# Patient Record
Sex: Female | Born: 1965 | Hispanic: No | Marital: Single | Smoking: Never smoker
Health system: Southern US, Community
[De-identification: ages and names within clinical notes are randomized; demographics above are authoritative.]

## PROBLEM LIST (undated history)

## (undated) DIAGNOSIS — K589 Irritable bowel syndrome without diarrhea: Secondary | ICD-10-CM

## (undated) DIAGNOSIS — N3010 Interstitial cystitis (chronic) without hematuria: Secondary | ICD-10-CM

## (undated) DIAGNOSIS — K219 Gastro-esophageal reflux disease without esophagitis: Secondary | ICD-10-CM

## (undated) DIAGNOSIS — E079 Disorder of thyroid, unspecified: Secondary | ICD-10-CM

## (undated) DIAGNOSIS — K633 Ulcer of intestine: Secondary | ICD-10-CM

## (undated) DIAGNOSIS — J45909 Unspecified asthma, uncomplicated: Secondary | ICD-10-CM

## (undated) HISTORY — PX: COSMETIC SURGERY: SHX468

## (undated) HISTORY — DX: Ulcer of intestine: K63.3

## (undated) HISTORY — PX: OTHER SURGICAL HISTORY: SHX169

## (undated) HISTORY — DX: Irritable bowel syndrome without diarrhea: K58.9

## (undated) HISTORY — DX: Disorder of thyroid, unspecified: E07.9

## (undated) HISTORY — PX: BREAST ENHANCEMENT SURGERY: SHX7

## (undated) HISTORY — DX: Unspecified asthma, uncomplicated: J45.909

## (undated) HISTORY — DX: Gastro-esophageal reflux disease without esophagitis: K21.9

## (undated) HISTORY — PX: BREAST SURGERY: SHX581

## (undated) HISTORY — DX: Interstitial cystitis (chronic) without hematuria: N30.10

## (undated) HISTORY — PX: TUBAL LIGATION: SHX77

## (undated) HISTORY — PX: SCAR REVISION: SHX5285

## (undated) HISTORY — PX: BUNIONECTOMY: SHX129

## (undated) HISTORY — PX: MYOMECTOMY: SHX85

---

## 2017-12-10 ENCOUNTER — Ambulatory Visit (HOSPITAL_COMMUNITY)
Admission: EM | Admit: 2017-12-10 | Discharge: 2017-12-10 | Disposition: A | Discharge status: Home / Self Care | Payer: BLUE CROSS/BLUE SHIELD | Attending: Family Medicine | Admitting: Family Medicine

## 2017-12-10 ENCOUNTER — Other Ambulatory Visit: Payer: Self-pay

## 2017-12-10 ENCOUNTER — Encounter (HOSPITAL_COMMUNITY): Payer: Self-pay | Admitting: Emergency Medicine

## 2017-12-10 ENCOUNTER — Ambulatory Visit (INDEPENDENT_AMBULATORY_CARE_PROVIDER_SITE_OTHER): Payer: BLUE CROSS/BLUE SHIELD

## 2017-12-10 DIAGNOSIS — S62347A Nondisplaced fracture of base of fifth metacarpal bone. left hand, initial encounter for closed fracture: Secondary | ICD-10-CM

## 2017-12-10 MED ORDER — NAPROXEN 500 MG PO TABS: 500.0000 mg | ORAL_TABLET | Freq: Two times a day (BID) | ORAL | 0 refills | Status: DC

## 2017-12-10 MED ORDER — HYDROCODONE-ACETAMINOPHEN 5-325 MG PO TABS: 1.0000 | ORAL_TABLET | Freq: Four times a day (QID) | ORAL | 0 refills | Status: DC | PRN

## 2017-12-10 NOTE — Discharge Instructions (Signed)
Ulnar gutter splint placed Continue conservative management of rest, ice, compression and elevation Take naproxen as needed for pain relief (may cause abdominal discomfort, ulcers, and GI bleeds avoid taking with other NSAIDs) Norco prescribed for break-through pain Follow up with orthopedist for further evaluation and management Return or go to the ER if you have any new or worsening symptoms (fever, chills, chest pain, abdominal pain, changes in bowel or bladder habits, pain radiating into lower legs, etc...)

## 2017-12-10 NOTE — ED Notes (Signed)
Bed: UC01 Expected date:  Expected time:  Means of arrival:  Comments: For appointments 

## 2017-12-10 NOTE — Progress Notes (Signed)
Orthopedic Tech Progress Note Patient Details:  Vanessa Fischer May 24, 1966 [id number]  Ortho Devices Type of Ortho Device: Ace wrap, Ulna gutter splint Ortho Device/Splint Location: lue Ortho Device/Splint Interventions: Application   Post Interventions Patient Tolerated: Well Instructions Provided: Care of device   Nikki DomCrawford, Violetta Lavalle 12/10/2017, 4:32 PM

## 2017-12-10 NOTE — ED Triage Notes (Signed)
Patient was playing in pool, splashing with a child in the pool, left little finger was pulled to the left, left little finger is painful, bruising to left hand

## 2017-12-10 NOTE — ED Provider Notes (Signed)
[facility]   [id number] 12/10/17 Arrival Time: 1443  SUBJECTIVE: History from: patient. Clark Clowdus is a 52 y.o. female complains of left little finger pain that began yesterday.  It began after she hyperextended her finger after catching a child jumping into pool.  Localizes the pain to the left little finger.  Describes the pain as constant.  Has tried ibuprofen 800mg  without relief.  Symptoms are made worse with flexion.  Denies similar symptoms in the past.  Complains of ecchymosis, erythema, and effusion.  Denies fever, chills, weakness, numbness and tingling.      ROS: As per HPI.  History reviewed. No pertinent past medical history. Past Surgical History:  Procedure Laterality Date  . bone spurs    . BREAST ENHANCEMENT SURGERY    . BREAST SURGERY    . BUNIONECTOMY    . CESAREAN SECTION    . COSMETIC SURGERY    . MYOMECTOMY    . SCAR REVISION    . TUBAL LIGATION     No Known Allergies No current facility-administered medications on file prior to encounter.    Current Outpatient Medications on File Prior to Encounter  Medication Sig Dispense Refill  . amphetamine-dextroamphetamine (ADDERALL) 30 MG tablet Take 30 mg by mouth daily.    . butalbital-acetaminophen-caffeine (FIORICET, ESGIC) 50-325-40 MG tablet Take by mouth 2 (two) times daily as needed for headache.    . doxycycline (VIBRAMYCIN) 100 MG capsule Take 100 mg by mouth 2 (two) times daily.    . Fremanezumab-vfrm (AJOVY Tedrow) Inject into the skin.    Marland Kitchen ibuprofen (ADVIL,MOTRIN) 200 MG tablet Take 200 mg by mouth every 6 (six) hours as needed.    Marland Kitchen LORazepam (ATIVAN) 1 MG tablet Take 1 mg by mouth every 8 (eight) hours.    . Mirabegron (MYRBETRIQ PO) Take by mouth.    Myriam Forehand FORMULARY NP thyroid pill-"natural product"    . progesterone (PROMETRIUM) 200 MG capsule Take 200 mg by mouth daily.     Social History   Socioeconomic History  . Marital status: Single    Spouse name: Not on file  . Number of  children: Not on file  . Years of education: Not on file  . Highest education level: Not on file  Occupational History  . Not on file  Social Needs  . Financial resource strain: Not on file  . Food insecurity:    Worry: Not on file    Inability: Not on file  . Transportation needs:    Medical: Not on file    Non-medical: Not on file  Tobacco Use  . Smoking status: Never Smoker  Substance and Sexual Activity  . Alcohol use: Yes  . Drug use: Never  . Sexual activity: Not on file  Lifestyle  . Physical activity:    Days per week: Not on file    Minutes per session: Not on file  . Stress: Not on file  Relationships  . Social connections:    Talks on phone: Not on file    Gets together: Not on file    Attends religious service: Not on file    Active member of club or organization: Not on file    Attends meetings of clubs or organizations: Not on file    Relationship status: Not on file  . Intimate partner violence:    Fear of current or ex partner: Not on file    Emotionally abused: Not on file    Physically abused: Not on  file    Forced sexual activity: Not on file  Other Topics Concern  . Not on file  Social History Narrative  . Not on file   Family History  Problem Relation Age of Onset  . Diabetes Mother   . Hypertension Father     OBJECTIVE:  Vitals:   12/10/17 1510  BP: 118/78  Pulse: 81  Resp: 18  Temp: 98.6 F (37 C)  TempSrc: Oral  SpO2: 97%    General appearance: AOx3; in no acute distress.  Head: NCAT Lungs: CTA bilaterally Heart: RRR.  Clear S1 and S2 without murmur, gallops, or rubs.  Radial pulses 2+ bilaterally. Musculoskeletal: Left hand Inspection: Skin warm, dry, clear and intact without obvious erythema, effusion, or ecchymosis.  Palpation: Tender to palpation about the fifth metacarpal, MCP joint, proximal phalange and PIP joint ROM: FROM active and passive with discomfort Strength: 4+/5 grip strength Skin: warm and dry Neurologic:  Ambulates without difficulty; Sensation intact about the upper extremities Psychological: alert and cooperative; anxious mood and affect  DIAGNOSTIC STUDIES:   CLINICAL DATA: Finger bent while diving  EXAM: LEFT FIFTH FINGER 2+V  COMPARISON: None.  FINDINGS: Frontal, oblique, and lateral views were obtained. There are nondisplaced fractures of the mid to distal aspects of the fifth metacarpal. No other fractures are evident. No dislocation. Joint spaces appear normal.  IMPRESSION: Nondisplaced fractures mid to distal fifth metacarpal. No other fracture. No dislocation. No evident arthropathy.  These results will be called to the ordering clinician or representative by the Radiologist Assistant, and communication documented in the PACS or zVision Dashboard.   Electronically Signed By: Bretta BangWilliam Woodruff III M.D. On: 12/10/2017 15:49  I have reviewed the x-rays and the radiologist interpretation. I am in agreement with the radiologist interpretation.    ASSESSMENT & PLAN:  1. Nondisplaced fracture of base of fifth metacarpal bone, left hand, initial encounter for closed fracture     No orders of the defined types were placed in this encounter.  Ulnar gutter splint placed Continue conservative management of rest, ice, compression and elevation Take naproxen as needed for pain relief (may cause abdominal discomfort, ulcers, and GI bleeds avoid taking with other NSAIDs) Norco prescribed for break-through pain Follow up with orthopedist for further evaluation and management Return or go to the ER if you have any new or worsening symptoms (fever, chills, chest pain, abdominal pain, changes in bowel or bladder habits, pain radiating into lower legs, etc...)   [place] Controlled Substances Registry consulted for this patient. I feel the risk/benefit ratio today is favorable for proceeding with this prescription for a controlled substance. Medication sedation precautions  given.  Reviewed expectations re: course of current medical issues. Questions answered. Outlined signs and symptoms indicating need for more acute intervention. Patient verbalized understanding. After Visit Summary given.    Rennis HardingWurst, Nike Southers, PA-C 12/10/17 1904

## 2017-12-13 ENCOUNTER — Ambulatory Visit (INDEPENDENT_AMBULATORY_CARE_PROVIDER_SITE_OTHER): Payer: BLUE CROSS/BLUE SHIELD | Admitting: Orthopaedic Surgery

## 2017-12-13 ENCOUNTER — Encounter (INDEPENDENT_AMBULATORY_CARE_PROVIDER_SITE_OTHER): Payer: Self-pay | Admitting: Orthopaedic Surgery

## 2017-12-13 DIAGNOSIS — S62337A Displaced fracture of neck of fifth metacarpal bone, left hand, initial encounter for closed fracture: Secondary | ICD-10-CM

## 2017-12-13 NOTE — Progress Notes (Signed)
Office Visit Note   Patient: Vanessa Fischer           Date of Birth: 08-12-1965           MRN: [id number] Visit Date: 12/13/2017              Requested by: No referring provider defined for this encounter. PCP: Patient, No Pcp Per   Assessment & Plan: Visit Diagnoses:  1. Closed displaced fracture of neck of fifth metacarpal bone of left hand, initial encounter     Plan: She does have a stable left hand fifth metacarpal fracture.  This should do well with time.  She is going to continue her ulnar gutter splint with taking off for hygiene purposes.  We will see her back in 3 weeks at that visit I would like 3 views of the left hand.  All question concerns were answered and addressed.  Follow-Up Instructions: Return in about 3 weeks (around 01/03/2018).   Orders:  No orders of the defined types were placed in this encounter.  No orders of the defined types were placed in this encounter.     Procedures: No procedures performed   Clinical Data: No additional findings.   Subjective: Chief Complaint  Patient presents with  . Left Hand - Injury, Fracture  Patient is a very pleasant 52 year old right-hand-dominant female who injured her left fifth finger on 12/09/2017 which was this past Saturday.  She was with a child of the pool and she is washing the child and she hyperextended her finger when she was jumping to catch the child from injuring himself with a pull and then that is when she injured her own fifth finger on her left hand.  She had immediate pain and she went to the emergency room.  They were able to find that she had a nondisplaced fifth metacarpal shaft fracture of the metacarpal neck and placed appropriately in a ulnar gutter splint.  This is her first follow-up since then.  She denies any significant pain but does report some pain in that hand.  She is never injured this area before.  HPI  Review of Systems She currently denies any active medical  issues.  Objective: Vital Signs: There were no vitals taken for this visit.  Physical Exam She is alert and oriented x3 in no acute distress Ortho Exam Examination of her left hand does show bruising and pain along the fifth metacarpal with no rotational deformities and no shortening.  She has normal flexion extension of her MCP and IP joints of the finger and hand.  She is neurovascular intact. Specialty Comments:  No specialty comments available.  Imaging: No results found. X-rays of her left hand independently reviewed show fracture lines in the fifth metacarpal along the neck and shaft with no displacement at all of the fracture fragments.  The MCP joint is well maintained.  The alignment is normal.  PMFS History: There are no active problems to display for this patient.  History reviewed. No pertinent past medical history.  Family History  Problem Relation Age of Onset  . Diabetes Mother   . Hypertension Father     Past Surgical History:  Procedure Laterality Date  . bone spurs    . BREAST ENHANCEMENT SURGERY    . BREAST SURGERY    . BUNIONECTOMY    . CESAREAN SECTION    . COSMETIC SURGERY    . MYOMECTOMY    . SCAR REVISION    . TUBAL LIGATION  Social History   Occupational History  . Not on file  Tobacco Use  . Smoking status: Never Smoker  Substance and Sexual Activity  . Alcohol use: Yes  . Drug use: Never  . Sexual activity: Not on file

## 2018-01-03 ENCOUNTER — Encounter (INDEPENDENT_AMBULATORY_CARE_PROVIDER_SITE_OTHER): Payer: Self-pay | Admitting: Orthopaedic Surgery

## 2018-01-03 ENCOUNTER — Ambulatory Visit (INDEPENDENT_AMBULATORY_CARE_PROVIDER_SITE_OTHER): Payer: Self-pay

## 2018-01-03 ENCOUNTER — Ambulatory Visit (INDEPENDENT_AMBULATORY_CARE_PROVIDER_SITE_OTHER): Payer: BLUE CROSS/BLUE SHIELD | Admitting: Orthopaedic Surgery

## 2018-01-03 DIAGNOSIS — S62337D Displaced fracture of neck of fifth metacarpal bone, left hand, subsequent encounter for fracture with routine healing: Secondary | ICD-10-CM

## 2018-01-03 NOTE — Progress Notes (Signed)
The patient is now about 3 weeks into an injury to her left hand that occurred during accident well diving into a pool to help out her child.  She sustained a left fifth metacarpal fracture.  We have been treating her in a splint.  She is doing well overall with minimal discomfort and pain.  X-rays reviewed today with her and show a comminuted metacarpal neck fracture the fifth metacarpal of her left hand but the overall alignment is very well maintained with no significant angulation.  The fingers are well-perfused and otherwise no blurring deformities or rotational deformities.  At this point she will come out of the splint as comfort allows but wear for protection otherwise.  She can work on gentle motion of her fingers and hand.  I would like to see her back in 4 weeks with a repeat 3 views of her left hand.  All question concerns were answered and addressed.  She knows to be careful with her hand still.

## 2018-01-31 ENCOUNTER — Ambulatory Visit (INDEPENDENT_AMBULATORY_CARE_PROVIDER_SITE_OTHER): Payer: BLUE CROSS/BLUE SHIELD | Admitting: Orthopaedic Surgery

## 2018-02-14 ENCOUNTER — Encounter (INDEPENDENT_AMBULATORY_CARE_PROVIDER_SITE_OTHER): Payer: Self-pay | Admitting: Orthopaedic Surgery

## 2018-02-14 ENCOUNTER — Ambulatory Visit (INDEPENDENT_AMBULATORY_CARE_PROVIDER_SITE_OTHER): Payer: BLUE CROSS/BLUE SHIELD | Admitting: Physician Assistant

## 2018-02-14 ENCOUNTER — Ambulatory Visit (INDEPENDENT_AMBULATORY_CARE_PROVIDER_SITE_OTHER): Payer: BLUE CROSS/BLUE SHIELD

## 2018-02-14 DIAGNOSIS — S62337D Displaced fracture of neck of fifth metacarpal bone, left hand, subsequent encounter for fracture with routine healing: Secondary | ICD-10-CM | POA: Insufficient documentation

## 2018-02-14 NOTE — Progress Notes (Signed)
HPI: Vanessa Fischer returns now 2 months status post left hand injury in which she sustained a fifth metacarpal fracture.  States overall she is doing a lot better still sore.  She is no longer wearing any type of splint.  She is been doing no lifting with the hand.  Physical exam: Left hand she is able to make a fist she brings fingers out completely straight.  She has some slight tenderness over the fifth metacarpal.  No rashes skin lesions ulcerations.  No malrotation.  Radiographs:Left hand 3 views: Show further consolidation of the fifth metacarpal neck fracture.  Fractures still evident.  Overall good alignment position.  Impression: Left fifth metacarpal neck fracture  Plan: She will continue to refrain from any heavy lifting with the left hand.  See her back in a month for repeat radiographs.  She will work on range of motion of the hand.  Questions encouraged and answered.

## 2018-03-14 ENCOUNTER — Ambulatory Visit (INDEPENDENT_AMBULATORY_CARE_PROVIDER_SITE_OTHER): Payer: BLUE CROSS/BLUE SHIELD | Admitting: Orthopaedic Surgery

## 2019-05-24 ENCOUNTER — Other Ambulatory Visit: Payer: Self-pay

## 2019-05-24 ENCOUNTER — Encounter (HOSPITAL_COMMUNITY): Payer: Self-pay

## 2019-05-24 ENCOUNTER — Ambulatory Visit (HOSPITAL_COMMUNITY)
Admission: EM | Admit: 2019-05-24 | Discharge: 2019-05-24 | Disposition: A | Discharge status: Home / Self Care | Payer: 59 | Attending: Urgent Care | Admitting: Urgent Care

## 2019-05-24 DIAGNOSIS — Z79899 Other long term (current) drug therapy: Secondary | ICD-10-CM | POA: Insufficient documentation

## 2019-05-24 DIAGNOSIS — Z833 Family history of diabetes mellitus: Secondary | ICD-10-CM | POA: Insufficient documentation

## 2019-05-24 DIAGNOSIS — J0100 Acute maxillary sinusitis, unspecified: Secondary | ICD-10-CM | POA: Diagnosis not present

## 2019-05-24 DIAGNOSIS — Z20828 Contact with and (suspected) exposure to other viral communicable diseases: Secondary | ICD-10-CM | POA: Diagnosis not present

## 2019-05-24 DIAGNOSIS — Z8249 Family history of ischemic heart disease and other diseases of the circulatory system: Secondary | ICD-10-CM | POA: Diagnosis not present

## 2019-05-24 MED ORDER — AMOXICILLIN-POT CLAVULANATE 875-125 MG PO TABS: 1.0000 | ORAL_TABLET | Freq: Two times a day (BID) | ORAL | 0 refills | Status: DC

## 2019-05-24 NOTE — ED Provider Notes (Signed)
[place]    CSN: [id number] Arrival date & time: 05/24/19  1825      History   Chief Complaint Chief Complaint  Patient presents with  . Cough    HPI Vanessa Fischer is a 53 y.o. female.   Patient reports to urgent care today for nasal congestion, discharge, headache, mild cough, eye discharge that have been present on and off for 1 month but have worsened over the last week. She reports green nasal discharge and productive cough with green sputum. She also reports facial and nasal pain. This morning she woke with white colored discharge in her eyes that cleared as the day went on and has not returned. She also endorses puffiness under her eyes. She feels this is similar to previous episodes of sinus infections that she has had.  She denies fever, chills, nausea, vomiting, diarrhea, shortness of breath and chest pain.   She reports taking prednisone 20mg  at home.      No past medical history on file.  Patient Active Problem List   Diagnosis Date Noted  . Displaced fracture of neck of left fifth metacarpal bone with routine healing 02/14/2018    Past Surgical History:  Procedure Laterality Date  . bone spurs    . BREAST ENHANCEMENT SURGERY    . BREAST SURGERY    . BUNIONECTOMY    . CESAREAN SECTION    . COSMETIC SURGERY    . MYOMECTOMY    . SCAR REVISION    . TUBAL LIGATION      OB History   No obstetric history on file.      Home Medications    Prior to Admission medications   Medication Sig Start Date End Date Taking? Authorizing Provider  amoxicillin-clavulanate (AUGMENTIN) 875-125 MG tablet Take 1 tablet by mouth 2 (two) times daily. 05/24/19   Braelyn Bordonaro, Marguerita Beards, PA-C  amphetamine-dextroamphetamine (ADDERALL) 30 MG tablet Take 30 mg by mouth daily.    [provider]  butalbital-acetaminophen-caffeine (FIORICET, ESGIC) 50-325-40 MG tablet Take by mouth 2 (two) times daily as needed for headache.    [provider]    Fremanezumab-vfrm (AJOVY Platea) Inject into the skin.    [provider]  HYDROcodone-acetaminophen (NORCO/VICODIN) 5-325 MG tablet Take 1-2 tablets by mouth every 6 (six) hours as needed for severe pain. 12/10/17   Wurst, [place], PA-C  ibuprofen (ADVIL,MOTRIN) 200 MG tablet Take 200 mg by mouth every 6 (six) hours as needed.    [provider]  LORazepam (ATIVAN) 1 MG tablet Take 1 mg by mouth every 8 (eight) hours.    [provider]  Mirabegron (MYRBETRIQ PO) Take by mouth.    [provider]  naproxen (NAPROSYN) 500 MG tablet Take 1 tablet (500 mg total) by mouth 2 (two) times daily. 12/10/17   Wurst, [place], PA-C  NON FORMULARY NP thyroid pill-"natural product"    [provider]  progesterone (PROMETRIUM) 200 MG capsule Take 200 mg by mouth daily.    [provider]    Family History Family History  Problem Relation Age of Onset  . Diabetes Mother   . Hypertension Father     Social History Social History   Tobacco Use  . Smoking status: Never Smoker  . Smokeless tobacco: Never Used  Substance Use Topics  . Alcohol use: Yes  . Drug use: Never     Allergies   Patient has no known allergies.   Review of Systems Review of Systems  Constitutional:  Negative for activity change, chills, fatigue and fever.  HENT: Positive for congestion, facial swelling, postnasal drip, rhinorrhea, sinus pressure and sinus pain. Negative for sore throat. Ear pain: Ear fullness.   Eyes: Positive for discharge and redness. Negative for pain and visual disturbance.  Respiratory: Negative for cough, chest tightness, shortness of breath and wheezing.   Cardiovascular: Negative for chest pain and palpitations.  Gastrointestinal: Negative for abdominal pain, diarrhea, nausea and vomiting.  Genitourinary: Negative for dysuria.  Musculoskeletal: Negative for arthralgias, back pain and myalgias.  Skin: Negative for color change and rash.   Neurological: Positive for headaches. Negative for syncope and light-headedness.  Psychiatric/Behavioral: Negative.   All other systems reviewed and are negative.    Physical Exam Triage Vital Signs ED Triage Vitals  Enc Vitals Group     BP 05/24/19 1842 130/85     Pulse Rate 05/24/19 1842 95     Resp 05/24/19 1842 17     Temp 05/24/19 1842 99 F (37.2 C)     Temp Source 05/24/19 1842 Oral     SpO2 05/24/19 1842 97 %     Weight 05/24/19 1838 125 lb (56.7 kg)     Height --      Head Circumference --      Peak Flow --      Pain Score 05/24/19 1838 0     Pain Loc --      Pain Edu? --      Excl. in GC? --    No data found.  Updated Vital Signs BP 130/85 (BP Location: Right Arm)   Pulse 95   Temp 99 F (37.2 C) (Oral)   Resp 17   Wt 125 lb (56.7 kg)   SpO2 97%   Visual Acuity Right Eye Distance:   Left Eye Distance:   Bilateral Distance:    Right Eye Near:   Left Eye Near:    Bilateral Near:     Physical Exam Vitals and nursing note reviewed.  Constitutional:      General: She is not in acute distress.    Appearance: She is well-developed and normal weight. She is not ill-appearing.  HENT:     Head: Normocephalic and atraumatic.     Comments: Bilateral edema under eyes. TTP and percussion along maxillary sinus.     Right Ear: Tympanic membrane normal.     Left Ear: Tympanic membrane normal.     Nose: Congestion and rhinorrhea present.     Mouth/Throat:     Mouth: Mucous membranes are moist.     Pharynx: Oropharynx is clear. No oropharyngeal exudate or posterior oropharyngeal erythema.  Eyes:     General: Scleral icterus present.     Pupils: Pupils are equal, round, and reactive to light.     Comments: conjunctiva mildly erythematous bilaterally, no discharge noted.  Cardiovascular:     Rate and Rhythm: Normal rate and regular rhythm.     Pulses: Normal pulses.     Heart sounds: No murmur. No friction rub. No gallop.   Pulmonary:     Effort: Pulmonary  effort is normal. No respiratory distress.     Breath sounds: Normal breath sounds. No rhonchi or rales.  Abdominal:     General: Bowel sounds are normal.     Palpations: Abdomen is soft.     Tenderness: There is no abdominal tenderness. There is no right CVA tenderness or left CVA tenderness.  Musculoskeletal:        General: No  swelling. Normal range of motion.     Cervical back: Normal range of motion and neck supple.  Lymphadenopathy:     Cervical: No cervical adenopathy.  Skin:    General: Skin is warm and dry.     Findings: Erythema (Flushing present on chest and neck) present.  Neurological:     General: No focal deficit present.     Mental Status: She is alert and oriented to person, place, and time.  Psychiatric:     Comments: Patient had fast rate of speech but with cogent and linear though patterns. Many fast movements and seemingly unable to sit still at times. Judgement good. Mood slightly elevated.       UC Treatments / Results  Labs (all labs ordered are listed, but only abnormal results are displayed) Labs Reviewed  NOVEL CORONAVIRUS, NAA (HOSP ORDER, SEND-OUT TO REF LAB; TAT 18-24 HRS)    EKG   Radiology No results found.  Procedures Procedures (including critical care time)  Medications Ordered in UC Medications - No data to display  Initial Impression / Assessment and Plan / UC Course  I have reviewed the triage vital signs and the nursing notes.  Pertinent labs & imaging results that were available during my care of the patient were reviewed by me and considered in my medical decision making (see chart for details).     # Acute Sinusitis - Patient seems to have a "double Worsening" with previous symptoms now exacerbated. Augmentin sent, Covid PCR sent. Strongly recommended establishing care with PCP in the area, resources given. Return, ED and Covid precautions discussed.   Final Clinical Impressions(s) / UC Diagnoses   Final diagnoses:    Acute maxillary sinusitis, recurrence not specified     Discharge Instructions     Take the medication as prescribed, augmentin 1 tablet 2 times a day for 7 days. Utilize sinus rinse as tolerated.   If drainage in eyes comes back and remains for the entire day, return to urgent care.   If your symptoms do not improve please follow up with your primary care or return to clinic.   If you become short of breath, have severe chest pain, severe vomiting or diarrhea please go to the Emergency Department.  If your Covid-19 test is positive, you will receive a phone call from [facility] regarding your results. Negative test results are not called. Both positive and negative results area always visible on MyChart. If you do not have a MyChart account, sign up instructions are in your discharge papers.   Persons who are directed to care for themselves at home may discontinue isolation under the following conditions:  . At least 10 days have passed since symptom onset and . At least 24 hours have passed without running a fever (this means without the use of fever-reducing medications) and . Other symptoms have improved.  Persons infected with COVID-19 who never develop symptoms may discontinue isolation and other precautions 10 days after the date of their first positive COVID-19 test.     ED Prescriptions    Medication Sig Dispense Auth. Provider   amoxicillin-clavulanate (AUGMENTIN) 875-125 MG tablet Take 1 tablet by mouth 2 (two) times daily. 14 tablet Taft Worthing, Veryl Speak, PA-C     PDMP not reviewed this encounter.   Hermelinda Medicus, PA-C 05/24/19 1935

## 2019-05-24 NOTE — ED Triage Notes (Signed)
Pt. Is here with a cough and swollen face, nasal congestion that started last Sunday.

## 2019-05-24 NOTE — Discharge Instructions (Addendum)
Take the medication as prescribed, augmentin 1 tablet 2 times a day for 7 days. Utilize sinus rinse as tolerated.   If drainage in eyes comes back and remains for the entire day, return to urgent care.   If your symptoms do not improve please follow up with your primary care or return to clinic.   If you become short of breath, have severe chest pain, severe vomiting or diarrhea please go to the Emergency Department.  If your Covid-19 test is positive, you will receive a phone call from [facility] regarding your results. Negative test results are not called. Both positive and negative results area always visible on MyChart. If you do not have a MyChart account, sign up instructions are in your discharge papers.   Persons who are directed to care for themselves at home may discontinue isolation under the following conditions:   At least 10 days have passed since symptom onset and  At least 24 hours have passed without running a fever (this means without the use of fever-reducing medications) and  Other symptoms have improved.  Persons infected with COVID-19 who never develop symptoms may discontinue isolation and other precautions 10 days after the date of their first positive COVID-19 test.

## 2019-05-27 LAB — NOVEL CORONAVIRUS, NAA (HOSP ORDER, SEND-OUT TO REF LAB; TAT 18-24 HRS): SARS-CoV-2, NAA: NOT DETECTED

## 2019-07-20 ENCOUNTER — Ambulatory Visit (HOSPITAL_COMMUNITY)
Admission: EM | Admit: 2019-07-20 | Discharge: 2019-07-20 | Disposition: A | Discharge status: Home / Self Care | Payer: 59 | Attending: Urgent Care | Admitting: Urgent Care

## 2019-07-20 ENCOUNTER — Encounter (HOSPITAL_COMMUNITY): Payer: Self-pay

## 2019-07-20 ENCOUNTER — Other Ambulatory Visit: Payer: Self-pay

## 2019-07-20 DIAGNOSIS — Z79899 Other long term (current) drug therapy: Secondary | ICD-10-CM | POA: Insufficient documentation

## 2019-07-20 DIAGNOSIS — R0981 Nasal congestion: Secondary | ICD-10-CM | POA: Diagnosis present

## 2019-07-20 DIAGNOSIS — R519 Headache, unspecified: Secondary | ICD-10-CM

## 2019-07-20 DIAGNOSIS — Z791 Long term (current) use of non-steroidal anti-inflammatories (NSAID): Secondary | ICD-10-CM | POA: Diagnosis not present

## 2019-07-20 DIAGNOSIS — R1084 Generalized abdominal pain: Secondary | ICD-10-CM

## 2019-07-20 DIAGNOSIS — Z833 Family history of diabetes mellitus: Secondary | ICD-10-CM | POA: Diagnosis not present

## 2019-07-20 DIAGNOSIS — R52 Pain, unspecified: Secondary | ICD-10-CM

## 2019-07-20 DIAGNOSIS — J3089 Other allergic rhinitis: Secondary | ICD-10-CM | POA: Diagnosis not present

## 2019-07-20 DIAGNOSIS — U071 COVID-19: Secondary | ICD-10-CM | POA: Diagnosis not present

## 2019-07-20 DIAGNOSIS — R198 Other specified symptoms and signs involving the digestive system and abdomen: Secondary | ICD-10-CM

## 2019-07-20 DIAGNOSIS — Z792 Long term (current) use of antibiotics: Secondary | ICD-10-CM | POA: Insufficient documentation

## 2019-07-20 DIAGNOSIS — K5900 Constipation, unspecified: Secondary | ICD-10-CM

## 2019-07-20 DIAGNOSIS — J0191 Acute recurrent sinusitis, unspecified: Secondary | ICD-10-CM

## 2019-07-20 DIAGNOSIS — R05 Cough: Secondary | ICD-10-CM

## 2019-07-20 DIAGNOSIS — R059 Cough, unspecified: Secondary | ICD-10-CM

## 2019-07-20 LAB — POCT URINALYSIS DIP (DEVICE)
Glucose, UA: NEGATIVE mg/dL
Hgb urine dipstick: NEGATIVE
Ketones, ur: 15 mg/dL — AB
Leukocytes,Ua: NEGATIVE
Nitrite: NEGATIVE
Protein, ur: NEGATIVE mg/dL
Specific Gravity, Urine: >=1.03 (ref 1.005–1.030)
Urobilinogen, UA: 0.2 mg/dL (ref 0.0–1.0)
pH: 5.5 (ref 5.0–8.0)

## 2019-07-20 MED ORDER — TIZANIDINE HCL 4 MG PO TABS: 4.0000 mg | ORAL_TABLET | Freq: Three times a day (TID) | ORAL | 0 refills | Status: DC | PRN

## 2019-07-20 MED ORDER — PREDNISONE 20 MG PO TABS: ORAL_TABLET | ORAL | 0 refills | Status: DC

## 2019-07-20 MED ORDER — AMOXICILLIN-POT CLAVULANATE 875-125 MG PO TABS: 1.0000 | ORAL_TABLET | Freq: Two times a day (BID) | ORAL | 0 refills | Status: DC

## 2019-07-20 NOTE — ED Triage Notes (Signed)
Patient presents to Urgent Care with complaints of headache and generalized body pains since the past 4-5 days. Patient reports she "feels like shit".

## 2019-07-20 NOTE — Discharge Instructions (Addendum)
Start prednisone to address sinusitis secondary to allergic rhinitis. If you are not better with this in 2 days, then start Augmentin. Make sure you continue to hydrate very well. Schedule Tylenol as well for body aches, fevers, belly pain. Dosing should be 500mg -650mg  every 6 hours as needed.

## 2019-07-20 NOTE — ED Provider Notes (Signed)
[facility]   MRN: [id number] DOB: January 16, 1966  Subjective:   Vanessa Fischer is a 54 y.o. female presenting for 5 day hx worsening moderate to severe malaise including sinus headaches, sinus congestion, intermittent right ear pain, abdominal pain, body aches around her thighs and torso.  Patient has been using OTC antihistamine and pseudoephedrine with minimal relief.  She has been having persistent trouble with her sinuses, last office visit in December 2020 she received Augmentin which helped until this current episode.  Regarding her abdominal pain, patient states that in the past couple days has been worse but does not have nausea or vomiting, urinary symptoms.  Denies chest pain, shortness of breath or wheezing.  No current facility-administered medications for this encounter.  Current Outpatient Medications:  .  amoxicillin-clavulanate (AUGMENTIN) 875-125 MG tablet, Take 1 tablet by mouth 2 (two) times daily., Disp: 14 tablet, Rfl: 0 .  amphetamine-dextroamphetamine (ADDERALL) 30 MG tablet, Take 30 mg by mouth daily., Disp: , Rfl:  .  butalbital-acetaminophen-caffeine (FIORICET, ESGIC) 50-325-40 MG tablet, Take by mouth 2 (two) times daily as needed for headache., Disp: , Rfl:  .  Fremanezumab-vfrm (AJOVY [place]), Inject into the skin., Disp: , Rfl:  .  HYDROcodone-acetaminophen (NORCO/VICODIN) 5-325 MG tablet, Take 1-2 tablets by mouth every 6 (six) hours as needed for severe pain., Disp: 10 tablet, Rfl: 0 .  ibuprofen (ADVIL,MOTRIN) 200 MG tablet, Take 200 mg by mouth every 6 (six) hours as needed., Disp: , Rfl:  .  LORazepam (ATIVAN) 1 MG tablet, Take 1 mg by mouth every 8 (eight) hours., Disp: , Rfl:  .  Mirabegron (MYRBETRIQ PO), Take by mouth., Disp: , Rfl:  .  naproxen (NAPROSYN) 500 MG tablet, Take 1 tablet (500 mg total) by mouth 2 (two) times daily., Disp: 30 tablet, Rfl: 0 .  NON FORMULARY, NP thyroid pill-"natural product", Disp: , Rfl:  .  progesterone (PROMETRIUM) 200 MG  capsule, Take 200 mg by mouth daily., Disp: , Rfl:    No Known Allergies  History reviewed. No pertinent past medical history.   Past Surgical History:  Procedure Laterality Date  . bone spurs    . BREAST ENHANCEMENT SURGERY    . BREAST SURGERY    . BUNIONECTOMY    . CESAREAN SECTION    . COSMETIC SURGERY    . MYOMECTOMY    . SCAR REVISION    . TUBAL LIGATION      Family History  Problem Relation Age of Onset  . Diabetes Mother   . Hypertension Father     Social History   Tobacco Use  . Smoking status: Never Smoker  . Smokeless tobacco: Never Used  Substance Use Topics  . Alcohol use: Yes  . Drug use: Never    ROS   Objective:   Vitals: BP 121/86 (BP Location: Right Arm)   Pulse 85   Temp 98.2 F (36.8 C) (Oral)   Resp 17   SpO2 98%   Physical Exam Constitutional:      General: She is not in acute distress.    Appearance: Normal appearance. She is well-developed. She is not ill-appearing, toxic-appearing or diaphoretic.  HENT:     Head: Normocephalic and atraumatic.     Right Ear: Tympanic membrane and external ear normal. There is no impacted cerumen.     Left Ear: Tympanic membrane and external ear normal. There is no impacted cerumen.     Nose: Nose normal.     Mouth/Throat:  Mouth: Mucous membranes are moist.     Pharynx: No oropharyngeal exudate or posterior oropharyngeal erythema.     Comments: Significant pnd in throat. Eyes:     General: No scleral icterus.       Right eye: No discharge.        Left eye: No discharge.     Extraocular Movements: Extraocular movements intact.     Conjunctiva/sclera: Conjunctivae normal.     Pupils: Pupils are equal, round, and reactive to light.  Cardiovascular:     Rate and Rhythm: Normal rate and regular rhythm.     Pulses: Normal pulses.     Heart sounds: Normal heart sounds. No murmur. No friction rub. No gallop.   Pulmonary:     Effort: Pulmonary effort is normal. No respiratory distress.      Breath sounds: Normal breath sounds. No stridor. No wheezing, rhonchi or rales.  Abdominal:     General: Bowel sounds are normal. There is no distension.     Palpations: Abdomen is soft. There is no mass.     Tenderness: There is abdominal tenderness. There is guarding. There is no right CVA tenderness, left CVA tenderness or rebound.  Skin:    General: Skin is warm and dry.     Findings: No rash.  Neurological:     Mental Status: She is alert and oriented to person, place, and time.  Psychiatric:        Mood and Affect: Mood normal.        Behavior: Behavior normal.        Thought Content: Thought content normal.        Judgment: Judgment normal.    Results for orders placed or performed during the hospital encounter of 07/20/19 (from the past 24 hour(s))  POCT urinalysis dip (device)     Status: Abnormal   Collection Time: 07/20/19  3:28 PM  Result Value Ref Range   Glucose, UA NEGATIVE NEGATIVE mg/dL   Bilirubin Urine SMALL (A) NEGATIVE   Ketones, ur 15 (A) NEGATIVE mg/dL   Specific Gravity, Urine >=1.030 1.005 - 1.030   Hgb urine dipstick NEGATIVE NEGATIVE   pH 5.5 5.0 - 8.0   Protein, ur NEGATIVE NEGATIVE mg/dL   Urobilinogen, UA 0.2 0.0 - 1.0 mg/dL   Nitrite NEGATIVE NEGATIVE   Leukocytes,Ua NEGATIVE NEGATIVE    Assessment and Plan :   1. Acute recurrent sinusitis, unspecified location   2. Sinus headache   3. Sinus congestion   4. Cough   5. Generalized abdominal pain   6. Abdominal guarding   7. Body aches   8. Constipation, unspecified constipation type   9. Allergic rhinitis due to other allergic trigger, unspecified seasonality     Start prednisone to address sinusitis secondary to allergic rhinitis.  Patient insistent the combination of an antibiotic and steroid works best for her.  But I counseled against using an antibiotic now.  She was agreeable to starting prednisone and feeling prescription for Augmentin in a couple of days only if she is not better.   She has significant abdominal pain and recommended she present to the ER if this persists.  Will otherwise use supportive care.  COVID 19 testing is pending. Counseled patient on potential for adverse effects with medications prescribed/recommended today, ER and return-to-clinic precautions discussed, patient verbalized understanding.     Jaynee Eagles, [place] 07/20/19 1552

## 2019-07-21 LAB — NOVEL CORONAVIRUS, NAA (HOSP ORDER, SEND-OUT TO REF LAB; TAT 18-24 HRS): SARS-CoV-2, NAA: DETECTED — AB

## 2019-07-21 LAB — URINE CULTURE: Culture: NO GROWTH

## 2019-07-22 ENCOUNTER — Encounter (HOSPITAL_COMMUNITY): Payer: Self-pay

## 2019-07-22 ENCOUNTER — Telehealth (HOSPITAL_COMMUNITY): Payer: Self-pay | Admitting: Emergency Medicine

## 2019-07-22 NOTE — Telephone Encounter (Signed)

## 2019-08-05 ENCOUNTER — Encounter (INDEPENDENT_AMBULATORY_CARE_PROVIDER_SITE_OTHER): Payer: Self-pay

## 2019-09-11 ENCOUNTER — Ambulatory Visit (INDEPENDENT_AMBULATORY_CARE_PROVIDER_SITE_OTHER)
Admission: RE | Admit: 2019-09-11 | Discharge: 2019-09-11 | Disposition: A | Discharge status: Home / Self Care | Payer: 59 | Source: Ambulatory Visit

## 2019-09-11 DIAGNOSIS — R1084 Generalized abdominal pain: Secondary | ICD-10-CM | POA: Diagnosis not present

## 2019-09-11 DIAGNOSIS — R252 Cramp and spasm: Secondary | ICD-10-CM

## 2019-09-11 NOTE — ED Provider Notes (Signed)
Virtual Visit via Video Note:  Vanessa Fischer  initiated request for Telemedicine visit with [facility] team. I connected with Vanessa Fischer  on 09/11/2019 at 5:21 PM  for a synchronized telemedicine visit using a video enabled HIPPA compliant telemedicine application. I verified that I am speaking with Vanessa Fischer  using two identifiers. Vanessa Bail, NP  was physically located in a [facility] Urgent care site and Vanessa Fischer was located at a different location.   The limitations of evaluation and management by telemedicine as well as the availability of in-person appointments were discussed. Patient was informed that she  may incur a bill ( including co-pay) for this virtual visit encounter. Vanessa Fischer  expressed understanding and gave verbal consent to proceed with virtual visit.     History of Present Illness:Vanessa Fischer  is a 54 y.o. female presents for evaluation of abdominal pain and muscle cramping intermittently since she was diagnosed with COVID on 07/21/2019.  She states she has taken muscle relaxers with moderate relief.  She states she missed work and needs a work note.  She denies fever, chills, vomiting, diarrhea, or other symptoms.  She declines coming to the UC for in-person evaluation at this time but will come if her symptoms continue/worsen.  Patient states she does not have a PCP at this time.     No Known Allergies   History reviewed. No pertinent past medical history.   Social History   Tobacco Use  . Smoking status: Never Smoker  . Smokeless tobacco: Never Used  Substance Use Topics  . Alcohol use: Yes  . Drug use: Never   ROS: as stated in HPI.  All other systems reviewed and negative.      Observations/Objective: Physical Exam  VITALS: Patient denies fever. GENERAL: Alert, appears well and in no acute distress. HEENT: Atraumatic. NECK: Normal movements of the head and neck. CARDIOPULMONARY: No increased WOB. Speaking in clear sentences. I:E ratio WNL.   MS: Moves all visible extremities without noticeable abnormality. PSYCH: Pleasant and cooperative, well-groomed. Speech normal rate and rhythm. Affect is appropriate. Insight and judgement are appropriate. Attention is focused, linear, and appropriate.  NEURO: CN grossly intact. Oriented as arrived to appointment on time with no prompting. Moves both UE equally.  SKIN: No obvious lesions, wounds, erythema, or cyanosis noted on face or hands.    Assessment and Plan:    ICD-10-CM   1. Muscle cramping  R25.2   2. Generalized abdominal pain  R10.84        Follow Up Instructions: Work note provided for today.  Discussed with patient that she will need to come for in-person visit to properly evaluate her abdominal pain; patient declines at this time but will come if her pain persists/worsens.     I discussed the assessment and treatment plan with the patient. The patient was provided an opportunity to ask questions and all were answered. The patient agreed with the plan and demonstrated an understanding of the instructions.   The patient was advised to call back or seek an in-person evaluation if the symptoms worsen or if the condition fails to improve as anticipated.      Vanessa Bail, NP  09/11/2019 5:21 PM           Vanessa Bail, NP 09/11/19 1721

## 2019-09-11 NOTE — Discharge Instructions (Addendum)
Come to the Urgent Care or go to the emergency department if you have worsening abdominal pain or other concerning symptoms.

## 2020-02-06 DIAGNOSIS — Z1231 Encounter for screening mammogram for malignant neoplasm of breast: Secondary | ICD-10-CM | POA: Diagnosis not present

## 2020-02-28 ENCOUNTER — Other Ambulatory Visit: Payer: Self-pay

## 2020-02-28 ENCOUNTER — Ambulatory Visit (HOSPITAL_COMMUNITY)
Admission: EM | Admit: 2020-02-28 | Discharge: 2020-02-28 | Disposition: A | Discharge status: Home / Self Care | Payer: BC Managed Care – PPO

## 2020-02-28 ENCOUNTER — Encounter (HOSPITAL_COMMUNITY): Payer: Self-pay

## 2020-02-28 DIAGNOSIS — Z760 Encounter for issue of repeat prescription: Secondary | ICD-10-CM

## 2020-02-28 NOTE — ED Triage Notes (Signed)
Pt states she just moved here and needs a refill on adderall 20 mg, Aderall XR 30mg . Pt states she doesn't have a PCP. And she has been out of those meds for a month.

## 2020-02-28 NOTE — ED Provider Notes (Signed)
[facility]    CSN: [id number] Arrival date & time: 02/28/20  1800      History   Chief Complaint Chief Complaint  Patient presents with  . medication refill    HPI Vanessa Fischer is a 54 y.o. female.   Patient reports urgent care for refill of her Adderall. She reports has been out of this for a month. She reports is not a primary care in the area and was previously managed by primary care provider in [place] and they were not able to fill this for the last 2 months. She reports she called the urgent care main office and was told by a guy" that I could get a one-time refill here". Patient cannot remember the name of this person. She has no other complaints today.     History reviewed. No pertinent past medical history.  Patient Active Problem List   Diagnosis Date Noted  . Displaced fracture of neck of left fifth metacarpal bone with routine healing 02/14/2018    Past Surgical History:  Procedure Laterality Date  . bone spurs    . BREAST ENHANCEMENT SURGERY    . BREAST SURGERY    . BUNIONECTOMY    . CESAREAN SECTION    . COSMETIC SURGERY    . MYOMECTOMY    . SCAR REVISION    . TUBAL LIGATION      OB History   No obstetric history on file.      Home Medications    Prior to Admission medications   Medication Sig Start Date End Date Taking? Authorizing Provider  amoxicillin-clavulanate (AUGMENTIN) 875-125 MG tablet Take 1 tablet by mouth 2 (two) times daily. 07/20/19   Wallis Bamberg, PA-C  amphetamine-dextroamphetamine (ADDERALL) 30 MG tablet Take 30 mg by mouth daily.    [provider]  butalbital-acetaminophen-caffeine (FIORICET, ESGIC) 50-325-40 MG tablet Take by mouth 2 (two) times daily as needed for headache.    [provider]  doxycycline (VIBRA-TABS) 100 MG tablet Take 100 mg by mouth daily. 12/21/19   [provider]  Fremanezumab-vfrm (AJOVY [place]) Inject into the skin.    [provider]    HYDROcodone-acetaminophen (NORCO/VICODIN) 5-325 MG tablet Take 1-2 tablets by mouth every 6 (six) hours as needed for severe pain. 12/10/17   Wurst, [place], PA-C  ibuprofen (ADVIL,MOTRIN) 200 MG tablet Take 200 mg by mouth every 6 (six) hours as needed.    [provider]  LORazepam (ATIVAN) 1 MG tablet Take 1 mg by mouth every 8 (eight) hours.    [provider]  Mirabegron (MYRBETRIQ PO) Take by mouth.    [provider]  naproxen (NAPROSYN) 500 MG tablet Take 1 tablet (500 mg total) by mouth 2 (two) times daily. 12/10/17   Wurst, [place], PA-C  NON FORMULARY NP thyroid pill-"natural product"    [provider]  NP THYROID 90 MG tablet Take 90 mg by mouth daily. 02/13/20   [provider]  predniSONE (DELTASONE) 20 MG tablet Take 2 tablets daily with breakfast. 07/20/19   Wallis Bamberg, PA-C  progesterone (PROMETRIUM) 200 MG capsule Take 200 mg by mouth daily.    [provider]  spironolactone (ALDACTONE) 100 MG tablet Take 100 mg by mouth daily. 02/04/20   [provider]  tiZANidine (ZANAFLEX) 4 MG tablet Take 1 tablet (4 mg total) by mouth every 8 (eight) hours as needed for muscle spasms. 07/20/19   Wallis Bamberg, PA-C    Family History Family History  Problem  Relation Age of Onset  . Diabetes Mother   . Hypertension Father     Social History Social History   Tobacco Use  . Smoking status: Never Smoker  . Smokeless tobacco: Never Used  Vaping Use  . Vaping Use: Never used  Substance Use Topics  . Alcohol use: Yes    Alcohol/week: 3.0 standard drinks    Types: 3 Glasses of wine per week  . Drug use: Never     Allergies   Patient has no known allergies.   Review of Systems Review of Systems   Physical Exam Triage Vital Signs ED Triage Vitals  Enc Vitals Group     BP 02/28/20 1937 125/75     Pulse Rate 02/28/20 1937 75     Resp 02/28/20 1937 16     Temp 02/28/20 1937 97.8 F (36.6 C)     Temp Source  02/28/20 1937 Oral     SpO2 02/28/20 1937 100 %     Weight 02/28/20 1938 130 lb (59 kg)     Height 02/28/20 1938 5\' 6"  (1.676 m)     Head Circumference --      Peak Flow --      Pain Score 02/28/20 1938 0     Pain Loc --      Pain Edu? --      Excl. in GC? --    No data found.  Updated Vital Signs BP 125/75   Pulse 75   Temp 97.8 F (36.6 C) (Oral)   Resp 16   Ht 5\' 6"  (1.676 m)   Wt 130 lb (59 kg)   SpO2 100%   BMI 20.98 kg/m   Visual Acuity Right Eye Distance:   Left Eye Distance:   Bilateral Distance:    Right Eye Near:   Left Eye Near:    Bilateral Near:     Physical Exam Vitals and nursing note reviewed.  Constitutional:      Appearance: Normal appearance.  Neurological:     Mental Status: She is alert.      UC Treatments / Results  Labs (all labs ordered are listed, but only abnormal results are displayed) Labs Reviewed - No data to display  EKG   Radiology No results found.  Procedures Procedures (including critical care time)  Medications Ordered in UC Medications - No data to display  Initial Impression / Assessment and Plan / UC Course  I have reviewed the triage vital signs and the nursing notes.  Pertinent labs & imaging results that were available during my care of the patient were reviewed by me and considered in my medical decision making (see chart for details).     Medication refill Patient is a 54 year old presenting for medication refill request. Discussed with her that we would not prescribe Adderall from the urgent care. A primary care resource for her to establish. Patient verbalized agreement understanding plan of care Final Clinical Impressions(s) / UC Diagnoses   Final diagnoses:  Medication refill     Discharge Instructions     You need to establish with a Primary care to have your medications refilled  Call one of the provided      ED Prescriptions    None     PDMP not reviewed this encounter.     , PA-C 02/28/20 1949

## 2020-02-28 NOTE — Discharge Instructions (Addendum)
You need to establish with a Primary care to have your medications refilled  Call one of the provided

## 2020-06-16 DIAGNOSIS — Z23 Encounter for immunization: Secondary | ICD-10-CM | POA: Diagnosis not present

## 2020-06-16 DIAGNOSIS — G43009 Migraine without aura, not intractable, without status migrainosus: Secondary | ICD-10-CM | POA: Diagnosis not present

## 2020-07-15 DIAGNOSIS — G43719 Chronic migraine without aura, intractable, without status migrainosus: Secondary | ICD-10-CM | POA: Diagnosis not present

## 2020-07-15 DIAGNOSIS — Z049 Encounter for examination and observation for unspecified reason: Secondary | ICD-10-CM | POA: Diagnosis not present

## 2020-07-15 DIAGNOSIS — Z79899 Other long term (current) drug therapy: Secondary | ICD-10-CM | POA: Diagnosis not present

## 2020-07-20 DIAGNOSIS — B351 Tinea unguium: Secondary | ICD-10-CM | POA: Diagnosis not present

## 2020-07-20 DIAGNOSIS — L608 Other nail disorders: Secondary | ICD-10-CM | POA: Diagnosis not present

## 2020-07-20 DIAGNOSIS — D225 Melanocytic nevi of trunk: Secondary | ICD-10-CM | POA: Diagnosis not present

## 2020-07-20 DIAGNOSIS — L578 Other skin changes due to chronic exposure to nonionizing radiation: Secondary | ICD-10-CM | POA: Diagnosis not present

## 2020-07-20 DIAGNOSIS — L700 Acne vulgaris: Secondary | ICD-10-CM | POA: Diagnosis not present

## 2020-07-20 DIAGNOSIS — D485 Neoplasm of uncertain behavior of skin: Secondary | ICD-10-CM | POA: Diagnosis not present

## 2020-07-20 DIAGNOSIS — L814 Other melanin hyperpigmentation: Secondary | ICD-10-CM | POA: Diagnosis not present

## 2020-07-24 DIAGNOSIS — G4700 Insomnia, unspecified: Secondary | ICD-10-CM | POA: Diagnosis not present

## 2020-07-24 DIAGNOSIS — Z79899 Other long term (current) drug therapy: Secondary | ICD-10-CM | POA: Diagnosis not present

## 2020-07-24 DIAGNOSIS — E038 Other specified hypothyroidism: Secondary | ICD-10-CM | POA: Diagnosis not present

## 2020-07-24 DIAGNOSIS — E349 Endocrine disorder, unspecified: Secondary | ICD-10-CM | POA: Diagnosis not present

## 2020-08-19 ENCOUNTER — Ambulatory Visit: Payer: BC Managed Care – PPO | Admitting: Internal Medicine

## 2020-08-19 NOTE — Progress Notes (Deleted)
Name: Vanessa Fischer  MRN/ DOB: [id number], 11/08/1965    Age/ Sex: 55 y.o., female    PCP: Collene Mares, [place]   Reason for Endocrinology Evaluation: Hypothyroidism      Date of Initial Endocrinology Evaluation: 08/19/2020     HPI: Ms. Vanessa Fischer is a 55 y.o. female with a past medical history of Migraine, GAD, ADD and Hypothyroidism. The patient presented for initial endocrinology clinic visit on 08/19/2020 for consultative assistance with her Hypothyriodism.   She has been diagnosed with hypothyroidism      She is on NP thyroid 90 mg daily     HISTORY:   Past Medical History: No past medical history on file. Past Surgical History:  Past Surgical History:  Procedure Laterality Date  . bone spurs    . BREAST ENHANCEMENT SURGERY    . BREAST SURGERY    . BUNIONECTOMY    . CESAREAN SECTION    . COSMETIC SURGERY    . MYOMECTOMY    . SCAR REVISION    . TUBAL LIGATION        Social History:  reports that she has never smoked. She has never used smokeless tobacco. She reports current alcohol use of about 3.0 standard drinks of alcohol per week. She reports that she does not use drugs.  Family History: family history includes Diabetes in her mother; Hypertension in her father.   HOME MEDICATIONS: Allergies as of 08/19/2020   No Known Allergies     Medication List       Accurate as of August 19, 2020  7:13 AM. If you have any questions, ask your nurse or doctor.        Adderall 30 MG tablet Generic drug: amphetamine-dextroamphetamine Take 30 mg by mouth daily.   AJOVY [place] Inject into the skin.   amoxicillin-clavulanate 875-125 MG tablet Commonly known as: AUGMENTIN Take 1 tablet by mouth 2 (two) times daily.   butalbital-acetaminophen-caffeine 50-325-40 MG tablet Commonly known as: FIORICET Take by mouth 2 (two) times daily as needed for headache.   doxycycline 100 MG tablet Commonly known as: VIBRA-TABS Take 100 mg by mouth daily.    HYDROcodone-acetaminophen 5-325 MG tablet Commonly known as: NORCO/VICODIN Take 1-2 tablets by mouth every 6 (six) hours as needed for severe pain.   ibuprofen 200 MG tablet Commonly known as: ADVIL Take 200 mg by mouth every 6 (six) hours as needed.   LORazepam 1 MG tablet Commonly known as: ATIVAN Take 1 mg by mouth every 8 (eight) hours.   MYRBETRIQ PO Take by mouth.   naproxen 500 MG tablet Commonly known as: NAPROSYN Take 1 tablet (500 mg total) by mouth 2 (two) times daily.   NON FORMULARY NP thyroid pill-"natural product"   NP Thyroid 90 MG tablet Generic drug: thyroid Take 90 mg by mouth daily.   predniSONE 20 MG tablet Commonly known as: DELTASONE Take 2 tablets daily with breakfast.   progesterone 200 MG capsule Commonly known as: PROMETRIUM Take 200 mg by mouth daily.   spironolactone 100 MG tablet Commonly known as: ALDACTONE Take 100 mg by mouth daily.   tiZANidine 4 MG tablet Commonly known as: Zanaflex Take 1 tablet (4 mg total) by mouth every 8 (eight) hours as needed for muscle spasms.         REVIEW OF SYSTEMS: A comprehensive ROS was conducted with the patient and is negative except as per HPI and below:  ROS     OBJECTIVE:  VS: There  were no vitals taken for this visit.   Wt Readings from Last 3 Encounters:  02/28/20 130 lb (59 kg)  05/24/19 125 lb (56.7 kg)     EXAM: General: Pt appears well and is in NAD  Eyes: External eye exam normal without stare, lid lag or exophthalmos.  EOM intact.  PERRL.  Neck: General: Supple without adenopathy. Thyroid: Thyroid size normal.  No goiter or nodules appreciated. No thyroid bruit.  Lungs: Clear with good BS bilat with no rales, rhonchi, or wheezes  Heart: Auscultation: RRR.  Abdomen: Normoactive bowel sounds, soft, nontender, without masses or organomegaly palpable  Extremities:  BL LE: No pretibial edema normal ROM and strength.  Skin: Hair: Texture and amount normal with gender  appropriate distribution Skin Inspection: No rashes, acanthosis nigricans/skin tags. No lipohypertrophy Skin Palpation: Skin temperature, texture, and thickness normal to palpation  Neuro: Cranial nerves: II - XII grossly intact  Cerebellar: Normal coordination and movement; no tremor Motor: Normal strength throughout DTRs: 2+ and symmetric in UE without delay in relaxation phase  Mental Status: Judgment, insight: Intact Orientation: Oriented to time, place, and person Memory: Intact for recent and remote events Mood and affect: No depression, anxiety, or agitation     DATA REVIEWED: ***    ASSESSMENT/PLAN/RECOMMENDATIONS:   1. Hypothyroidism:    Medications :  Signed electronically by: Lyndle Herrlich, MD  [facility]  [facility] [address], [address] [place], [place] [address] Phone: [phone] FAX: [phone]   CC: Collene Mares, [place] [address] [place] [address] Phone: [phone] Fax: [phone]   Return to Endocrinology clinic as below: Future Appointments  Date Time Provider Department Center  08/19/2020  7:50 AM Kelce Bouton, Konrad Dolores, MD [id number] None

## 2020-10-05 ENCOUNTER — Ambulatory Visit: Payer: BC Managed Care – PPO | Admitting: Internal Medicine

## 2021-02-10 ENCOUNTER — Other Ambulatory Visit: Payer: Self-pay

## 2021-02-10 ENCOUNTER — Ambulatory Visit
Admission: EM | Admit: 2021-02-10 | Discharge: 2021-02-10 | Disposition: A | Discharge status: Home / Self Care | Payer: Commercial Managed Care - PPO | Attending: Urgent Care | Admitting: Urgent Care

## 2021-02-10 ENCOUNTER — Encounter: Payer: Self-pay | Admitting: Emergency Medicine

## 2021-02-10 DIAGNOSIS — M79602 Pain in left arm: Secondary | ICD-10-CM

## 2021-02-10 DIAGNOSIS — W57XXXA Bitten or stung by nonvenomous insect and other nonvenomous arthropods, initial encounter: Secondary | ICD-10-CM

## 2021-02-10 DIAGNOSIS — R21 Rash and other nonspecific skin eruption: Secondary | ICD-10-CM

## 2021-02-10 MED ORDER — TRIAMCINOLONE ACETONIDE 0.1 % EX CREA: 1.0000 "application " | TOPICAL_CREAM | Freq: Two times a day (BID) | CUTANEOUS | 0 refills | Status: DC

## 2021-02-10 MED ORDER — DOXYCYCLINE HYCLATE 100 MG PO CAPS: 100.0000 mg | ORAL_CAPSULE | Freq: Two times a day (BID) | ORAL | 0 refills | Status: DC

## 2021-02-10 NOTE — ED Triage Notes (Signed)
Pt sts pain in left arm x several days that is now improved; pt sts rash to chest and back that doesn't appear to cross midline that is itchy and painful; pt sts picking at rash; pt also sts her acne is acting up; pt unsure if she could have had a tick in her back as well

## 2021-02-10 NOTE — ED Provider Notes (Signed)
[facility]   MRN: [id number] DOB: Jul 20, 1965  Subjective:   Vanessa Fischer is a 55 y.o. female presenting for 5 day history of rash, left arm pain/soreness for the past week.  Her primary concern is that she pulled an engorged tick off of her left thoracic back.  She does not know how long it was there.  Has exposure to a lot of ticks at her home into her husband's work.  She has felt achy since then, developed a rash over her torso and chest.  Rash is itchy.  Woke up with left arm pain this morning that intermittently radiates into the hand. Has also had left trapezius pain. Has been using ibuprofen, tizanidine with some relief.  No fever, headaches, red eyes, sore throat, cough, neck pain, weakness, falls, trauma.  No current facility-administered medications for this encounter.  Current Outpatient Medications:    amoxicillin-clavulanate (AUGMENTIN) 875-125 MG tablet, Take 1 tablet by mouth 2 (two) times daily., Disp: 14 tablet, Rfl: 0   amphetamine-dextroamphetamine (ADDERALL) 30 MG tablet, Take 30 mg by mouth daily., Disp: , Rfl:    butalbital-acetaminophen-caffeine (FIORICET, ESGIC) 50-325-40 MG tablet, Take by mouth 2 (two) times daily as needed for headache., Disp: , Rfl:    doxycycline (VIBRA-TABS) 100 MG tablet, Take 100 mg by mouth daily., Disp: , Rfl:    Fremanezumab-vfrm (AJOVY [place]), Inject into the skin., Disp: , Rfl:    HYDROcodone-acetaminophen (NORCO/VICODIN) 5-325 MG tablet, Take 1-2 tablets by mouth every 6 (six) hours as needed for severe pain., Disp: 10 tablet, Rfl: 0   ibuprofen (ADVIL,MOTRIN) 200 MG tablet, Take 200 mg by mouth every 6 (six) hours as needed., Disp: , Rfl:    LORazepam (ATIVAN) 1 MG tablet, Take 1 mg by mouth every 8 (eight) hours., Disp: , Rfl:    Mirabegron (MYRBETRIQ PO), Take by mouth., Disp: , Rfl:    naproxen (NAPROSYN) 500 MG tablet, Take 1 tablet (500 mg total) by mouth 2 (two) times daily., Disp: 30 tablet, Rfl: 0   NON FORMULARY, NP  thyroid pill-"natural product", Disp: , Rfl:    NP THYROID 90 MG tablet, Take 90 mg by mouth daily., Disp: , Rfl:    predniSONE (DELTASONE) 20 MG tablet, Take 2 tablets daily with breakfast., Disp: 10 tablet, Rfl: 0   progesterone (PROMETRIUM) 200 MG capsule, Take 200 mg by mouth daily., Disp: , Rfl:    spironolactone (ALDACTONE) 100 MG tablet, Take 100 mg by mouth daily., Disp: , Rfl:    tiZANidine (ZANAFLEX) 4 MG tablet, Take 1 tablet (4 mg total) by mouth every 8 (eight) hours as needed for muscle spasms., Disp: 30 tablet, Rfl: 0   No Known Allergies  No past medical history on file.   Past Surgical History:  Procedure Laterality Date   bone spurs     BREAST ENHANCEMENT SURGERY     BREAST SURGERY     BUNIONECTOMY     CESAREAN SECTION     COSMETIC SURGERY     MYOMECTOMY     SCAR REVISION     TUBAL LIGATION      Family History  Problem Relation Age of Onset   Diabetes Mother    Hypertension Father     Social History   Tobacco Use   Smoking status: Never   Smokeless tobacco: Never  Vaping Use   Vaping Use: Never used  Substance Use Topics   Alcohol use: Yes    Alcohol/week: 3.0 standard drinks    Types:  3 Glasses of wine per week   Drug use: Never    ROS   Objective:   Vitals: BP 129/82 (BP Location: Left Arm)   Pulse (!) 105   Temp 97.8 F (36.6 C) (Oral)   Resp 18   SpO2 97%   Physical Exam Constitutional:      General: She is not in acute distress.    Appearance: Normal appearance. She is well-developed. She is not ill-appearing, toxic-appearing or diaphoretic.  HENT:     Head: Normocephalic and atraumatic.     Right Ear: External ear normal.     Left Ear: External ear normal.     Nose: Nose normal.     Mouth/Throat:     Mouth: Mucous membranes are moist.     Pharynx: Oropharynx is clear.  Eyes:     General: No scleral icterus.       Right eye: No discharge.        Left eye: No discharge.     Extraocular Movements: Extraocular movements  intact.     Conjunctiva/sclera: Conjunctivae normal.     Pupils: Pupils are equal, round, and reactive to light.  Cardiovascular:     Rate and Rhythm: Normal rate.  Pulmonary:     Effort: Pulmonary effort is normal.  Skin:    General: Skin is warm and dry.     Findings: Rash (macular rash over the upper chest and back to a lesser extent; there is a resolving tick bite wound over her left upper back without tenderness or warmth, drainage of pus or bleeding) present.  Neurological:     General: No focal deficit present.     Mental Status: She is alert and oriented to person, place, and time.     Motor: No weakness.     Coordination: Coordination normal.     Gait: Gait normal.     Deep Tendon Reflexes: Reflexes normal.  Psychiatric:        Mood and Affect: Mood normal.        Behavior: Behavior normal.        Thought Content: Thought content normal.        Judgment: Judgment normal.    Assessment and Plan :   PDMP not reviewed this encounter.  1. Rash and nonspecific skin eruption   2. Tick bite, unspecified site, initial encounter   3. Left arm pain     We will treat empirically for tickborne illness with doxycycline 100 mg twice daily.  Given the timeline of when her symptoms started, will defer blood work for the antibodies of tickborne illness.  Recommended supportive care otherwise.  Use triamcinolone for contact dermatitis about the tick bite wound. Counseled patient on potential for adverse effects with medications prescribed/recommended today, ER and return-to-clinic precautions discussed, patient verbalized understanding.    Wallis Bamberg, [place] 02/11/21 [phone]

## 2021-03-16 ENCOUNTER — Other Ambulatory Visit: Payer: Self-pay | Admitting: Internal Medicine

## 2021-03-16 DIAGNOSIS — Z1231 Encounter for screening mammogram for malignant neoplasm of breast: Secondary | ICD-10-CM

## 2021-05-14 ENCOUNTER — Ambulatory Visit: Payer: Commercial Managed Care - PPO

## 2021-08-19 ENCOUNTER — Ambulatory Visit
Admission: RE | Admit: 2021-08-19 | Discharge: 2021-08-19 | Disposition: A | Discharge status: Home / Self Care | Payer: Commercial Managed Care - PPO | Source: Ambulatory Visit | Attending: Internal Medicine | Admitting: Internal Medicine

## 2021-09-04 ENCOUNTER — Inpatient Hospital Stay (HOSPITAL_COMMUNITY): Payer: Commercial Managed Care - PPO | Admitting: Certified Registered Nurse Anesthetist

## 2021-09-04 ENCOUNTER — Other Ambulatory Visit: Payer: Self-pay

## 2021-09-04 ENCOUNTER — Encounter (HOSPITAL_COMMUNITY): Admission: EM | Disposition: A | Discharge status: Home / Self Care | Payer: Self-pay | Source: Home / Self Care

## 2021-09-04 ENCOUNTER — Emergency Department (HOSPITAL_COMMUNITY): Payer: Commercial Managed Care - PPO

## 2021-09-04 ENCOUNTER — Inpatient Hospital Stay (HOSPITAL_COMMUNITY)
Admission: EM | Admit: 2021-09-04 | Discharge: 2021-09-09 | DRG: 328 | Disposition: A | Discharge status: Home / Self Care | Payer: Commercial Managed Care - PPO | Attending: Surgery | Admitting: Surgery

## 2021-09-04 ENCOUNTER — Encounter (HOSPITAL_COMMUNITY): Payer: Self-pay | Admitting: Emergency Medicine

## 2021-09-04 DIAGNOSIS — N9489 Other specified conditions associated with female genital organs and menstrual cycle: Secondary | ICD-10-CM | POA: Diagnosis not present

## 2021-09-04 DIAGNOSIS — K581 Irritable bowel syndrome with constipation: Secondary | ICD-10-CM | POA: Diagnosis present

## 2021-09-04 DIAGNOSIS — Z79899 Other long term (current) drug therapy: Secondary | ICD-10-CM

## 2021-09-04 DIAGNOSIS — Z8249 Family history of ischemic heart disease and other diseases of the circulatory system: Secondary | ICD-10-CM | POA: Diagnosis not present

## 2021-09-04 DIAGNOSIS — Z833 Family history of diabetes mellitus: Secondary | ICD-10-CM

## 2021-09-04 DIAGNOSIS — K255 Chronic or unspecified gastric ulcer with perforation: Principal | ICD-10-CM | POA: Diagnosis present

## 2021-09-04 DIAGNOSIS — R198 Other specified symptoms and signs involving the digestive system and abdomen: Principal | ICD-10-CM | POA: Diagnosis present

## 2021-09-04 DIAGNOSIS — K828 Other specified diseases of gallbladder: Secondary | ICD-10-CM

## 2021-09-04 DIAGNOSIS — G43909 Migraine, unspecified, not intractable, without status migrainosus: Secondary | ICD-10-CM | POA: Diagnosis present

## 2021-09-04 DIAGNOSIS — E876 Hypokalemia: Secondary | ICD-10-CM | POA: Diagnosis not present

## 2021-09-04 HISTORY — PX: LAPAROTOMY: SHX154

## 2021-09-04 LAB — COMPREHENSIVE METABOLIC PANEL
ALT: 12 U/L (ref 0–44)
AST: 16 U/L (ref 15–41)
Albumin: 4.3 g/dL (ref 3.5–5.0)
Alkaline Phosphatase: 77 U/L (ref 38–126)
Anion gap: 10 (ref 5–15)
BUN: 17 mg/dL (ref 6–20)
CO2: 26 mmol/L (ref 22–32)
Calcium: 11.3 mg/dL — ABNORMAL HIGH (ref 8.9–10.3)
Chloride: 101 mmol/L (ref 98–111)
Creatinine, Ser: 0.75 mg/dL (ref 0.44–1.00)
GFR, Estimated: >60 mL/min (ref >60)
Glucose, Bld: 152 mg/dL — ABNORMAL HIGH (ref 70–99)
Potassium: 4.9 mmol/L (ref 3.5–5.1)
Sodium: 137 mmol/L (ref 135–145)
Total Bilirubin: 0.5 mg/dL (ref 0.3–1.2)
Total Protein: 7.7 g/dL (ref 6.5–8.1)

## 2021-09-04 LAB — LIPASE, BLOOD: Lipase: 24 U/L (ref 11–51)

## 2021-09-04 LAB — URINALYSIS, ROUTINE W REFLEX MICROSCOPIC
Bacteria, UA: NONE SEEN
Bilirubin Urine: NEGATIVE
Glucose, UA: NEGATIVE mg/dL
Hgb urine dipstick: NEGATIVE
Ketones, ur: 20 mg/dL — AB
Leukocytes,Ua: NEGATIVE
Nitrite: NEGATIVE
Protein, ur: 30 mg/dL — AB
Specific Gravity, Urine: 1.03 (ref 1.005–1.030)
pH: 5 (ref 5.0–8.0)

## 2021-09-04 LAB — CBC
HCT: 46.3 % — ABNORMAL HIGH (ref 36.0–46.0)
Hemoglobin: 15.9 g/dL — ABNORMAL HIGH (ref 12.0–15.0)
MCH: 32.5 pg (ref 26.0–34.0)
MCHC: 34.3 g/dL (ref 30.0–36.0)
MCV: 94.7 fL (ref 80.0–100.0)
Platelets: 360 10*3/uL (ref 150–400)
RBC: 4.89 MIL/uL (ref 3.87–5.11)
RDW: 12.1 % (ref 11.5–15.5)
WBC: 21 10*3/uL — ABNORMAL HIGH (ref 4.0–10.5)
nRBC: 0 % (ref 0.0–0.2)

## 2021-09-04 SURGERY — LAPAROTOMY, EXPLORATORY
Anesthesia: General

## 2021-09-04 MED ORDER — PIPERACILLIN-TAZOBACTAM 3.375 G IVPB
INTRAVENOUS | Status: AC
Start: 2021-09-04 — End: 2021-09-04
  Filled 2021-09-04: qty 50

## 2021-09-04 MED ORDER — DEXTROSE 5 % IV SOLN
500.0000 mg | Freq: Four times a day (QID) | INTRAVENOUS | Status: DC | PRN
  Administered 2021-09-05 – 2021-09-07 (×7): 500 mg via INTRAVENOUS
  Filled 2021-09-04 (×2): qty 5
  Filled 2021-09-04: qty 500
  Filled 2021-09-04: qty 5
  Filled 2021-09-04 (×2): qty 500
  Filled 2021-09-04: qty 5
  Filled 2021-09-04 (×2): qty 500
  Filled 2021-09-04 (×2): qty 5

## 2021-09-04 MED ORDER — ACETAMINOPHEN 10 MG/ML IV SOLN
INTRAVENOUS | Status: AC
  Filled 2021-09-04: qty 100

## 2021-09-04 MED ORDER — HYDROMORPHONE HCL 1 MG/ML IJ SOLN
0.5000 mg | INTRAMUSCULAR | Status: DC | PRN
  Administered 2021-09-04 – 2021-09-08 (×21): 1 mg via INTRAVENOUS
  Filled 2021-09-04 (×22): qty 1

## 2021-09-04 MED ORDER — LACTATED RINGERS IV SOLN: INTRAVENOUS | Status: DC | PRN

## 2021-09-04 MED ORDER — SUCCINYLCHOLINE CHLORIDE 200 MG/10ML IV SOSY
PREFILLED_SYRINGE | INTRAVENOUS | Status: AC
  Filled 2021-09-04: qty 10

## 2021-09-04 MED ORDER — SUCCINYLCHOLINE CHLORIDE 200 MG/10ML IV SOSY
PREFILLED_SYRINGE | INTRAVENOUS | Status: DC | PRN
  Administered 2021-09-04: 100 mg via INTRAVENOUS

## 2021-09-04 MED ORDER — SUGAMMADEX SODIUM 200 MG/2ML IV SOLN
INTRAVENOUS | Status: DC | PRN
  Administered 2021-09-04: 150 mg via INTRAVENOUS

## 2021-09-04 MED ORDER — SODIUM CHLORIDE 0.9 % IR SOLN
Status: DC | PRN
  Administered 2021-09-04 (×3): 1000 mL

## 2021-09-04 MED ORDER — ACETAMINOPHEN 10 MG/ML IV SOLN
1000.0000 mg | Freq: Four times a day (QID) | INTRAVENOUS | Status: AC
Start: 2021-09-04 — End: 2021-09-05
  Administered 2021-09-04 – 2021-09-05 (×3): 1000 mg via INTRAVENOUS
  Filled 2021-09-04 (×3): qty 100

## 2021-09-04 MED ORDER — FENTANYL CITRATE (PF) 100 MCG/2ML IJ SOLN
INTRAMUSCULAR | Status: DC | PRN
  Administered 2021-09-04 (×3): 50 ug via INTRAVENOUS

## 2021-09-04 MED ORDER — SODIUM CHLORIDE 0.9 % IV SOLN: INTRAVENOUS | Status: DC

## 2021-09-04 MED ORDER — ONDANSETRON 4 MG PO TBDP
4.0000 mg | ORAL_TABLET | Freq: Four times a day (QID) | ORAL | Status: DC | PRN
Start: 2021-09-04 — End: 2021-09-09

## 2021-09-04 MED ORDER — FENTANYL CITRATE PF 50 MCG/ML IJ SOSY
PREFILLED_SYRINGE | INTRAMUSCULAR | Status: AC
  Filled 2021-09-04: qty 2

## 2021-09-04 MED ORDER — PANTOPRAZOLE INFUSION (NEW) - SIMPLE MED
8.0000 mg/h | INTRAVENOUS | Status: AC
Start: 2021-09-04 — End: 2021-09-07
  Administered 2021-09-04: 8 mg/h via INTRAVENOUS
  Filled 2021-09-04: qty 100
  Filled 2021-09-04: qty 80

## 2021-09-04 MED ORDER — PROPOFOL 10 MG/ML IV BOLUS
INTRAVENOUS | Status: DC | PRN
  Administered 2021-09-04: 130 mg via INTRAVENOUS

## 2021-09-04 MED ORDER — PROPOFOL 10 MG/ML IV BOLUS
INTRAVENOUS | Status: AC
  Filled 2021-09-04: qty 20

## 2021-09-04 MED ORDER — PIPERACILLIN-TAZOBACTAM 3.375 G IVPB 30 MIN
3.3750 g | Freq: Once | INTRAVENOUS | Status: AC
  Administered 2021-09-04: 3.375 g via INTRAVENOUS

## 2021-09-04 MED ORDER — MIDAZOLAM HCL 5 MG/5ML IJ SOLN
INTRAMUSCULAR | Status: DC | PRN
Start: 2021-09-04 — End: 2021-09-04
  Administered 2021-09-04: 2 mg via INTRAVENOUS

## 2021-09-04 MED ORDER — PHENYLEPHRINE HCL (PRESSORS) 10 MG/ML IV SOLN
INTRAVENOUS | Status: AC
  Filled 2021-09-04: qty 2

## 2021-09-04 MED ORDER — ONDANSETRON HCL 4 MG/2ML IJ SOLN
4.0000 mg | Freq: Four times a day (QID) | INTRAMUSCULAR | Status: DC | PRN
  Administered 2021-09-07: 4 mg via INTRAVENOUS
  Filled 2021-09-04: qty 2

## 2021-09-04 MED ORDER — ENOXAPARIN SODIUM 40 MG/0.4ML IJ SOSY
40.0000 mg | PREFILLED_SYRINGE | INTRAMUSCULAR | Status: DC
  Administered 2021-09-05 – 2021-09-09 (×5): 40 mg via SUBCUTANEOUS
  Filled 2021-09-04 (×6): qty 0.4

## 2021-09-04 MED ORDER — BISACODYL 10 MG RE SUPP
10.0000 mg | Freq: Every day | RECTAL | Status: DC | PRN
  Administered 2021-09-08: 10 mg via RECTAL
  Filled 2021-09-04: qty 1

## 2021-09-04 MED ORDER — PIPERACILLIN-TAZOBACTAM 3.375 G IVPB
3.3750 g | Freq: Three times a day (TID) | INTRAVENOUS | Status: DC
  Administered 2021-09-05 – 2021-09-09 (×14): 3.375 g via INTRAVENOUS
  Filled 2021-09-04 (×16): qty 50

## 2021-09-04 MED ORDER — ONDANSETRON HCL 4 MG/2ML IJ SOLN
INTRAMUSCULAR | Status: DC | PRN
  Administered 2021-09-04: 4 mg via INTRAVENOUS

## 2021-09-04 MED ORDER — LIDOCAINE HCL (PF) 2 % IJ SOLN
INTRAMUSCULAR | Status: AC
Start: 2021-09-04 — End: ?
  Filled 2021-09-04: qty 5

## 2021-09-04 MED ORDER — ROCURONIUM BROMIDE 10 MG/ML (PF) SYRINGE
PREFILLED_SYRINGE | INTRAVENOUS | Status: AC
  Filled 2021-09-04: qty 10

## 2021-09-04 MED ORDER — ROCURONIUM BROMIDE 10 MG/ML (PF) SYRINGE
PREFILLED_SYRINGE | INTRAVENOUS | Status: DC | PRN
  Administered 2021-09-04: 60 mg via INTRAVENOUS

## 2021-09-04 MED ORDER — PANTOPRAZOLE SODIUM 40 MG IV SOLR
40.0000 mg | Freq: Two times a day (BID) | INTRAVENOUS | Status: DC
  Administered 2021-09-08: 40 mg via INTRAVENOUS
  Filled 2021-09-04: qty 10

## 2021-09-04 MED ORDER — DIPHENHYDRAMINE HCL 25 MG PO CAPS: 25.0000 mg | ORAL_CAPSULE | Freq: Four times a day (QID) | ORAL | Status: DC | PRN

## 2021-09-04 MED ORDER — LIDOCAINE 2% (20 MG/ML) 5 ML SYRINGE
INTRAMUSCULAR | Status: DC | PRN
Start: 2021-09-04 — End: 2021-09-04
  Administered 2021-09-04: 60 mg via INTRAVENOUS

## 2021-09-04 MED ORDER — DEXAMETHASONE SODIUM PHOSPHATE 10 MG/ML IJ SOLN
INTRAMUSCULAR | Status: DC | PRN
  Administered 2021-09-04: 10 mg via INTRAVENOUS

## 2021-09-04 MED ORDER — HYDROMORPHONE HCL 1 MG/ML IJ SOLN
1.0000 mg | Freq: Once | INTRAMUSCULAR | Status: AC
  Administered 2021-09-04: 1 mg via INTRAVENOUS
  Filled 2021-09-04: qty 1

## 2021-09-04 MED ORDER — FENTANYL CITRATE (PF) 250 MCG/5ML IJ SOLN
INTRAMUSCULAR | Status: AC
  Filled 2021-09-04: qty 5

## 2021-09-04 MED ORDER — DIPHENHYDRAMINE HCL 50 MG/ML IJ SOLN: 25.0000 mg | Freq: Four times a day (QID) | INTRAMUSCULAR | Status: DC | PRN

## 2021-09-04 MED ORDER — METOPROLOL TARTRATE 5 MG/5ML IV SOLN: 5.0000 mg | Freq: Four times a day (QID) | INTRAVENOUS | Status: DC | PRN

## 2021-09-04 MED ORDER — PHENYLEPHRINE 40 MCG/ML (10ML) SYRINGE FOR IV PUSH (FOR BLOOD PRESSURE SUPPORT)
PREFILLED_SYRINGE | INTRAVENOUS | Status: DC | PRN
  Administered 2021-09-04: 80 ug via INTRAVENOUS
  Administered 2021-09-04: 120 ug via INTRAVENOUS

## 2021-09-04 MED ORDER — FENTANYL CITRATE PF 50 MCG/ML IJ SOSY
25.0000 ug | PREFILLED_SYRINGE | INTRAMUSCULAR | Status: DC | PRN
  Administered 2021-09-04: 50 ug via INTRAVENOUS

## 2021-09-04 MED ORDER — PANTOPRAZOLE 80MG IVPB - SIMPLE MED
80.0000 mg | Freq: Once | INTRAVENOUS | Status: AC
  Administered 2021-09-04: 80 mg via INTRAVENOUS
  Filled 2021-09-04: qty 80

## 2021-09-04 MED ORDER — ONDANSETRON HCL 4 MG/2ML IJ SOLN
INTRAMUSCULAR | Status: AC
  Filled 2021-09-04: qty 2

## 2021-09-04 MED ORDER — DEXAMETHASONE SODIUM PHOSPHATE 10 MG/ML IJ SOLN
INTRAMUSCULAR | Status: AC
  Filled 2021-09-04: qty 1

## 2021-09-04 SURGICAL SUPPLY — 38 items
BAG COUNTER SPONGE SURGICOUNT (BAG) IMPLANT
CHLORAPREP W/TINT 26 (MISCELLANEOUS) IMPLANT
COVER MAYO STAND STRL (DRAPES) ×2 IMPLANT
COVER SURGICAL LIGHT HANDLE (MISCELLANEOUS) ×2 IMPLANT
DRAIN CHANNEL 19F RND (DRAIN) ×1 IMPLANT
DRAPE LAPAROSCOPIC ABDOMINAL (DRAPES) ×2 IMPLANT
DRSG OPSITE POSTOP 4X10 (GAUZE/BANDAGES/DRESSINGS) IMPLANT
DRSG OPSITE POSTOP 4X6 (GAUZE/BANDAGES/DRESSINGS) IMPLANT
DRSG OPSITE POSTOP 4X8 (GAUZE/BANDAGES/DRESSINGS) ×1 IMPLANT
ELECT REM PT RETURN 15FT ADLT (MISCELLANEOUS) ×2 IMPLANT
EVACUATOR SILICONE 100CC (DRAIN) ×1 IMPLANT
GAUZE SPONGE 4X4 12PLY STRL (GAUZE/BANDAGES/DRESSINGS) ×1 IMPLANT
GLOVE SURG ENC MOIS LTX SZ6 (GLOVE) ×4 IMPLANT
GLOVE SURG MICRO LTX SZ6 (GLOVE) ×2 IMPLANT
GLOVE SURG UNDER LTX SZ6.5 (GLOVE) ×4 IMPLANT
GOWN STRL REUS W/ TWL LRG LVL3 (GOWN DISPOSABLE) ×1 IMPLANT
GOWN STRL REUS W/ TWL XL LVL3 (GOWN DISPOSABLE) IMPLANT
GOWN STRL REUS W/TWL LRG LVL3 (GOWN DISPOSABLE) ×1
GOWN STRL REUS W/TWL XL LVL3 (GOWN DISPOSABLE)
KIT TURNOVER KIT A (KITS) IMPLANT
PACK GENERAL/GYN (CUSTOM PROCEDURE TRAY) ×2 IMPLANT
SPONGE T-LAP 18X18 ~~LOC~~+RFID (SPONGE) IMPLANT
STAPLER VISISTAT 35W (STAPLE) ×1 IMPLANT
SUT ETHILON 3 0 PS 1 (SUTURE) ×1 IMPLANT
SUT MNCRL AB 4-0 PS2 18 (SUTURE) IMPLANT
SUT NOVA T20/GS 25 (SUTURE) IMPLANT
SUT NYLON 3 0 (SUTURE) ×1 IMPLANT
SUT PDS AB 1 TP1 96 (SUTURE) ×2 IMPLANT
SUT SILK 2 0 (SUTURE) ×1
SUT SILK 2 0 SH CR/8 (SUTURE) ×2 IMPLANT
SUT SILK 2-0 18XBRD TIE 12 (SUTURE) ×1 IMPLANT
SUT SILK 3 0 (SUTURE) ×1
SUT SILK 3 0 SH CR/8 (SUTURE) ×2 IMPLANT
SUT SILK 3-0 18XBRD TIE 12 (SUTURE) ×1 IMPLANT
TOWEL OR 17X26 10 PK STRL BLUE (TOWEL DISPOSABLE) IMPLANT
TOWEL OR NON WOVEN STRL DISP B (DISPOSABLE) ×2 IMPLANT
TRAY FOLEY MTR SLVR 14FR STAT (SET/KITS/TRAYS/PACK) ×2 IMPLANT
TRAY FOLEY MTR SLVR 16FR STAT (SET/KITS/TRAYS/PACK) ×2 IMPLANT

## 2021-09-04 NOTE — Progress Notes (Signed)
Report received from PACU RN, pt arrived to unit via bed into room, 1336. Education was initiated on use of call bell for need/safety   ?

## 2021-09-04 NOTE — Anesthesia Postprocedure Evaluation (Signed)
Anesthesia Post Note ? ?Patient: Vanessa Fischer ? ?Procedure(s) Performed: EXPLORATORY LAPAROTOMY with Cheree Ditto Patch ? ?  ? ?Patient location during evaluation: PACU ?Anesthesia Type: General ?Level of consciousness: awake ?Pain management: pain level controlled ?Vital Signs Assessment: post-procedure vital signs reviewed and stable ?Respiratory status: spontaneous breathing ?Cardiovascular status: stable ?Postop Assessment: no apparent nausea or vomiting ?Anesthetic complications: no ? ? ?No notable events documented. ? ?Last Vitals:  ?Vitals:  ? 09/04/21 2034 09/04/21 2223  ?BP: 131/90 107/74  ?Pulse: 86 83  ?Resp: 18 14  ?Temp: 36.8 ?C 36.6 ?C  ?SpO2: 100% 99%  ?  ?Last Pain:  ?Vitals:  ? 09/04/21 2223  ?TempSrc: Oral  ?PainSc:   ? ? ?  ?  ?  ?  ?  ?  ? ?Zabian Swayne ? ? ? ? ?

## 2021-09-04 NOTE — Anesthesia Preprocedure Evaluation (Addendum)
Anesthesia Evaluation  ?Patient identified by MRN, date of birth, ID band ?Patient awake ? ?General Assessment Comment:History noted ?Dr. Nyoka Cowden ? ?Reviewed: ?Allergy & Precautions, NPO status , Patient's Chart, lab work & pertinent test results ? ?Airway ?Mallampati: II ? ?TM Distance: >3 FB ? ? ? ? Dental ?  ?Pulmonary ?neg pulmonary ROS,  ?  ?breath sounds clear to auscultation ? ? ? ? ? ? Cardiovascular ?negative cardio ROS ? ? ?Rhythm:Regular Rate:Normal ? ? ?  ?Neuro/Psych ?negative neurological ROS ? negative psych ROS  ? GI/Hepatic ?Neg liver ROS, History noted ?Dr. Nyoka Cowden ?  ?Endo/Other  ?negative endocrine ROS ? Renal/GU ?negative Renal ROS  ? ?  ?Musculoskeletal ? ? Abdominal ?  ?Peds ? Hematology ?  ?Anesthesia Other Findings ? ? Reproductive/Obstetrics ? ?  ? ? ? ? ? ? ? ? ? ? ? ? ? ?  ?  ? ? ? ? ? ? ? ?Anesthesia Physical ?Anesthesia Plan ? ?ASA: 3 ? ?Anesthesia Plan: General  ? ?Post-op Pain Management:   ? ?Induction: Intravenous ? ?PONV Risk Score and Plan: 3 ? ?Airway Management Planned: Oral ETT ? ?Additional Equipment:  ? ?Intra-op Plan:  ? ?Post-operative Plan: Possible Post-op intubation/ventilation ? ?Informed Consent: I have reviewed the patients History and Physical, chart, labs and discussed the procedure including the risks, benefits and alternatives for the proposed anesthesia with the patient or authorized representative who has indicated his/her understanding and acceptance.  ? ? ? ?Dental advisory given ? ?Plan Discussed with: Anesthesiologist, CRNA and Surgeon ? ?Anesthesia Plan Comments:   ? ? ? ? ? ?Anesthesia Quick Evaluation ? ?

## 2021-09-04 NOTE — Progress Notes (Signed)
Pharmacy Antibiotic Note ? ?Vanessa Fischer is a 56 y.o. female admitted on 09/04/2021 with gastric perforation. Pharmacy has been consulted for Zosyn dosing. ? ?Plan: ?Zosyn 3.375g IV x 1 over 30 minutes, then Zosyn 3.375g IV q8h (each dose infused over 4 hours) ? ?Need for further dosage adjustment appears unlikely at present, so pharmacy will sign off at this time.  Please reconsult if a change in clinical status warrants re-evaluation of dosage. ? ? ?Height: 5\' 6"  (167.6 cm) ?Weight: 61.2 kg (135 lb) ?IBW/kg (Calculated) : 59.3 ? ?Temp (24hrs), Avg:98.3 ?F (36.8 ?C), Min:98.3 ?F (36.8 ?C), Max:98.3 ?F (36.8 ?C) ? ?Recent Labs  ?Lab 09/04/21 ?1609  ?WBC 21.0*  ?CREATININE 0.75  ?  ?Estimated Creatinine Clearance: 76.1 mL/min (by C-G formula based on SCr of 0.75 mg/dL).   ? ?No Known Allergies ? ? ?Thank you for allowing pharmacy to be a part of this patient?s care. ? ?11/04/21 ?09/04/2021 5:43 PM ? ?

## 2021-09-04 NOTE — Transfer of Care (Signed)
Immediate Anesthesia Transfer of Care Note ? ?Patient: [name] ? ?Procedure(s) Performed: EXPLORATORY LAPAROTOMY ? ?Patient Location: PACU ? ?Anesthesia Type:General ? ?Level of Consciousness: awake, alert  and oriented ? ?Airway & Oxygen Therapy: Patient Spontanous Breathing and Patient connected to face mask oxygen ? ?Post-op Assessment: Report given to RN and Post -op Vital signs reviewed and stable ? ?Post vital signs: Reviewed and stable ? ?Last Vitals:  ?Vitals Value Taken Time  ?BP 131/88 09/04/21 1945  ?Temp    ?Pulse 80 09/04/21 1945  ?Resp 17 09/04/21 1945  ?SpO2 98 % 09/04/21 1945  ?Vitals shown include unvalidated device data. ? ?Last Pain:  ?Vitals:  ? 09/04/21 1734  ?TempSrc:   ?PainSc: 10-Worst pain ever  ?   ? ?  ? ?Complications: No notable events documented. ?

## 2021-09-04 NOTE — ED Provider Notes (Signed)
?[facility]-EMERGENCY DEPT ?Provider Note ? ? ?CSN: [id number] ?Arrival date & time: 09/04/21  1555 ? ?  ? ?History ? ?Chief Complaint  ?Patient presents with  ? Abdominal Pain  ? ? ?Vanessa Fischer is a 56 y.o. female. ? ?HPI ?She complains of left sided abdominal pain radiating to her left flank, onset this morning and felt as "crampy," and severe.  No prior similar problem.  No history of kidney stones.  She feels nauseated today and try to eat some pizza but felt like it made her pain worse with and eating more.  She has not had any vomiting or diarrhea.  She denies fever, cough or shortness of breath.  She states pain is worse with movement and deep breathing. ?  ? ?Home Medications ?Prior to Admission medications   ?Medication Sig Start Date End Date Taking? Authorizing Provider  ?amoxicillin-clavulanate (AUGMENTIN) 875-125 MG tablet Take 1 tablet by mouth 2 (two) times daily. ?Patient not taking: Reported on 02/10/2021 07/20/19   Wallis Bamberg, PA-C  ?amphetamine-dextroamphetamine (ADDERALL) 30 MG tablet Take 30 mg by mouth daily.    [provider]  ?butalbital-acetaminophen-caffeine (FIORICET, ESGIC) 50-325-40 MG tablet Take by mouth 2 (two) times daily as needed for headache.    [provider]  ?doxycycline (VIBRA-TABS) 100 MG tablet Take 100 mg by mouth daily. 12/21/19   [provider]  ?doxycycline (VIBRAMYCIN) 100 MG capsule Take 1 capsule (100 mg total) by mouth 2 (two) times daily. 02/10/21   Wallis Bamberg, PA-C  ?Fremanezumab-vfrm (AJOVY [place]) Inject into the skin.    [provider]  ?HYDROcodone-acetaminophen (NORCO/VICODIN) 5-325 MG tablet Take 1-2 tablets by mouth every 6 (six) hours as needed for severe pain. 12/10/17   [place], [place], PA-C  ?ibuprofen (ADVIL,MOTRIN) 200 MG tablet Take 200 mg by mouth every 6 (six) hours as needed.    [provider]  ?LORazepam (ATIVAN) 1 MG tablet Take 1 mg by mouth every 8 (eight) hours.    [provider]  ?Mirabegron (MYRBETRIQ PO) Take by mouth.    [provider]  ?naproxen (NAPROSYN) 500 MG tablet Take 1 tablet (500 mg total) by mouth 2 (two) times daily. 12/10/17   Rennis Harding, PA-C  ?NON FORMULARY NP thyroid pill-"natural product"    [provider]  ?NP THYROID 90 MG tablet Take 90 mg by mouth daily. 02/13/20   [provider]  ?predniSONE (DELTASONE) 20 MG tablet Take 2 tablets daily with breakfast. ?Patient not taking: Reported on 02/10/2021 07/20/19   Wallis Bamberg, PA-C  ?progesterone (PROMETRIUM) 200 MG capsule Take 200 mg by mouth daily.    [provider]  ?spironolactone (ALDACTONE) 100 MG tablet Take 100 mg by mouth daily. 02/04/20   [provider]  ?tiZANidine (ZANAFLEX) 4 MG tablet Take 1 tablet (4 mg total) by mouth every 8 (eight) hours as needed for muscle spasms. 07/20/19   Wallis Bamberg, PA-C  ?triamcinolone cream (KENALOG) 0.1 % Apply 1 application topically 2 (two) times daily. 02/10/21   Wallis Bamberg, PA-C  ?   ? ?Allergies    ?Patient has no known allergies.   ? ?Review of Systems   ?Review of Systems ? ?Physical Exam ?Updated Vital Signs ?BP 124/82 (BP Location: Right Arm)   Pulse (!) 114   Temp 98.3 ?F (36.8 ?C) (Oral)   Resp 18   SpO2 98%  ?Physical Exam ?Vitals and nursing note reviewed.  ?Constitutional:   ?   General: She is in  acute distress (Uncomfortable, groaning and crying.).  ?   Appearance: She is well-developed. She is not ill-appearing or diaphoretic.  ?HENT:  ?   Head: Normocephalic and atraumatic.  ?   Right Ear: External ear normal.  ?   Left Ear: External ear normal.  ?Eyes:  ?   Conjunctiva/sclera: Conjunctivae normal.  ?   Pupils: Pupils are equal, round, and reactive to light.  ?Neck:  ?   Trachea: Phonation normal.  ?Cardiovascular:  ?   Rate and Rhythm: Regular rhythm. Tachycardia present.  ?   Heart sounds: Normal heart sounds.  ?Pulmonary:  ?   Effort: Pulmonary effort is normal. No respiratory distress.  ?   Breath sounds:  Normal breath sounds. No stridor. No wheezing or rhonchi.  ?Abdominal:  ?   General: There is no distension.  ?   Palpations: Abdomen is soft. There is no mass.  ?   Tenderness: There is abdominal tenderness (Moderate left upper and left lower). There is guarding.  ?   Hernia: No hernia is present.  ?Musculoskeletal:     ?   General: Normal range of motion.  ?   Cervical back: Normal range of motion and neck supple.  ?Skin: ?   General: Skin is warm and dry.  ?Neurological:  ?   Mental Status: She is alert and oriented to person, place, and time.  ?   Cranial Nerves: No cranial nerve deficit.  ?   Sensory: No sensory deficit.  ?   Motor: No abnormal muscle tone.  ?   Coordination: Coordination normal.  ?Psychiatric:     ?   Mood and Affect: Mood normal.     ?   Behavior: Behavior normal.     ?   Thought Content: Thought content normal.     ?   Judgment: Judgment normal.  ? ? ?ED Results / Procedures / Treatments   ?Labs ?(all labs ordered are listed, but only abnormal results are displayed) ?Labs Reviewed  ?LIPASE, BLOOD  ?COMPREHENSIVE METABOLIC PANEL  ?CBC  ?URINALYSIS, ROUTINE W REFLEX MICROSCOPIC  ?I-STAT BETA HCG BLOOD, ED ([facility], [facility], [facility] ONLY)  ? ? ?EKG ?None ? ?Radiology ?No results found. ? ?Procedures ?Procedures  ? ? ?Medications Ordered in ED ?Medications  ?HYDROmorphone (DILAUDID) injection 1 mg (has no administration in time range)  ?0.9 %  sodium chloride infusion (has no administration in time range)  ? ? ?ED Course/ Medical Decision Making/ A&P ?Clinical Course as of 09/04/21 1946  ?Sat Sep 04, 2021  ?1654 Case discussed with on-call radiologist, Dr. Si Gaul who is reviewing her CT images.  Patient appears to have a perforation of her stomach causing her pain. [EW]  ?1659 Patient more comfortable after initial dose of Dilaudid, but feels like the pain is returning [EW]  ?  ?Clinical Course User Index ?[EW] Mancel Bale, MD  ? ?                        ?Medical Decision Making ?Patient presents for atraumatic  abdominal pain which started this morning spontaneously and is associated with a sensation of food intolerance.  No prior similar problems.  No history of kidney stones.  Pain is acute and severe with a crampy sensation. ? ?Problems Addressed: ?Perforated abdominal viscus: acute illness or injury ?   Details: Sudden onset of pain today. ? ?Amount and/or Complexity of Data Reviewed ?Independent Historian:  ?   Details: She is a cogent historian ?  Labs: ordered. ?   Details: CBC, metabolic panel- Normal except, WBC high, Glucose high , Calcium high, HB jigh ?Radiology: ordered. ?   Details: CT Abdomen and Pelvis- Free air lower pole of stomach with inflammatory changes. ?Discussion of management or test interpretation with external provider(s): Discussed with Dr. Fredricka Bonineonnor, General Surgeon. She will see patient in the ED  ? ?Risk ?Prescription drug management. ?Decision regarding hospitalization. ?Elective major surgery with no identified risk factors. ?Risk Details: Patient with sudden onset of abdominal pain without trauma. CT images are consistent with perforation of stomach, suspected to be secondary to a gastric ulcer. She has incidental findings of Left adnexal mass and gall bladder sludge. She will likely require operative repair. No evidence for kidney stone obstructive disease. Doubt intraabdominal infection at this point. Likely mild dehydration present. ? ? ? ? ? ? ? ? ? ? ?Final Clinical Impression(s) / ED Diagnoses ?Final diagnoses:  ?Perforated abdominal viscus  ?Gallbladder sludge  ?Adnexal mass  ? ? ?Rx / DC Orders ?ED Discharge Orders   ? ? None  ? ?  ? ? ?  ?Mancel BaleWentz, Aayliah Rotenberry, MD ?09/04/21 1947 ? ?

## 2021-09-04 NOTE — Op Note (Signed)
Operative Note ? ?Vanessa Fischer  ?[id number]  ?[id number]  ?09/04/2021 ? ? ?Surgeon: Phylliss Blakes MD FACS ?  ?Procedure performed: Exploratory laparotomy, omental pedicle flap/Graham patch closure of perforated prepyloric ulcer ?  ?Preop diagnosis: Perforated ?Post-op diagnosis/intraop findings: Perforated prepyloric ulcer ? ?Procedure status: Emergent ?Infection present at time of surgery: YES, pus and purulence in the upper abdomen from perforated ulcer ?  ?Specimens: No ?Retained items: 2 [place] round Blake drain ?EBL: Minimal cc ?Complications: none ?  ?Description of procedure: After obtaining informed consent the patient was taken to the operating room and placed supine on operating room table where general endotracheal anesthesia was initiated, preoperative antibiotics were administered, SCDs applied, and a formal timeout was performed.  Foley catheter was inserted under sterile conditions.  A nasogastric tube was placed by the CRNA and this was palpated in the stomach to confirm placement intraoperatively.  The abdomen was prepped and draped in usual sterile fashion and an upper midline laparotomy was created.  Peritoneal cavity was entered and immediately encountered purulent fluid and gastric contents in the upper abdomen.  This was aspirated and the liver was lifted to examine the anterior surface of the stomach upon which point the perforation was immediately visible.  This was an approximately 4 mm perforation within an approximately 6 cm diameter area of induration in the prepyloric/pyloric channel region.  No active bleeding.  An omental pedicle flap was dissected using cautery and brought up to easily cover this area.  This was secured with 4 simple interrupted 3-0 silk sutures which were placed full-thickness across the ulcer defect as well as above and below it and then tied down over the omental pedicle flap.  The abdomen was then irrigated with several liters of warm sterile saline.  The effluent was  clear.  A 19 French round Blake drain was introduced through the right lower quadrant and directed up into the epigastrium underneath the liver overlying the Midwife.  This is secured to the skin with a 2-0 nylon.  The abdomen was once again inspected and hemostasis confirmed the fascia was then closed with running looped #1 PDS starting at either end and tying centrally.  The skin was loosely closed with staples.  Sterile dressings were then applied.  The patient was then awakened, extubated and taken to PACU in stable condition.  ?  ?All counts were correct at the completion of the case.   ?

## 2021-09-04 NOTE — Plan of Care (Signed)
  Problem: Education: Goal: Knowledge of General Education information will improve Description: Including pain rating scale, medication(s)/side effects and non-pharmacologic comfort measures Outcome: Progressing   Problem: Coping: Goal: Level of anxiety will decrease Outcome: Progressing   

## 2021-09-04 NOTE — Anesthesia Procedure Notes (Signed)
Procedure Name: Intubation ?Date/Time: 09/04/2021 6:31 PM ?Performed by: Montel Clock, CRNA ?Pre-anesthesia Checklist: Patient identified, Emergency Drugs available, Suction available, Patient being monitored and Timeout performed ?Patient Re-evaluated:Patient Re-evaluated prior to induction ?Oxygen Delivery Method: Circle system utilized ?Preoxygenation: Pre-oxygenation with 100% oxygen ?Induction Type: IV induction, Rapid sequence and Cricoid Pressure applied ?Laryngoscope Size: Mac and 3 ?Grade View: Grade I ?Tube type: Oral ?Tube size: 7.0 mm ?Number of attempts: 1 ?Airway Equipment and Method: Stylet ?Placement Confirmation: ETT inserted through vocal cords under direct vision, positive ETCO2 and breath sounds checked- equal and bilateral ?Secured at: 21 cm ?Tube secured with: Tape ?Dental Injury: Teeth and Oropharynx as per pre-operative assessment  ? ? ? ? ?

## 2021-09-04 NOTE — H&P (Addendum)
Surgical Evaluation ? ?Chief Complaint: Abdominal pain ? ?HPI: 56 year old woman who presents with acute onset and worsening left upper abdominal pain which began this morning and felt like a very bad cramp and was quite severe.  This radiates to her left groin, her left flank, her back and shoulders.  This is associated with nausea and she notes that her pain was exacerbated by trying to eat.  Denies any known fever, cough or shortness of breath.  Pain is worsened with eating, deep breaths and movement.  She does note that for the last month or so she has been having worse heartburn and indigestion as well as early satiety.  Has had some intermittent nausea and occasional emesis which was nonbloody and nonbilious.  She denies any previous ulcer diagnosis or previous endoscopy.  She does use BC powders fairly regularly for migraines, and drinks alcohol on occasion, does note more alcohol intake over the holidays when she was off of work.  Does not use any nicotine products but does vape with THC.  Pain is constant and severe. ?Previous abdominal surgery includes C-section, tubal ligation, diagnostic laparoscopy, and a mini tummy tuck. ?She works Company secretaryselling Medicare benefits.  She has a 54 year old son at home.  Endorses a fairly high stress level. ? ? ?No Known Allergies ? ?History reviewed. No pertinent past medical history. ? ?Past Surgical History:  ?Procedure Laterality Date  ? AUGMENTATION MAMMAPLASTY Bilateral 2004  ? AUGMENTATION MAMMAPLASTY Bilateral 2010  ? AUGMENTATION MAMMAPLASTY Bilateral 2014  ? bone spurs    ? BREAST ENHANCEMENT SURGERY    ? BREAST SURGERY    ? BUNIONECTOMY    ? CESAREAN SECTION    ? COSMETIC SURGERY    ? MYOMECTOMY    ? SCAR REVISION    ? TUBAL LIGATION    ? ? ?Family History  ?Problem Relation Age of Onset  ? Diabetes Mother   ? Hypertension Father   ? Breast cancer Neg Hx   ? ? ?Social History  ? ?Socioeconomic History  ? Marital status: Single  ?  Spouse name: Not on file  ? Number of  children: Not on file  ? Years of education: Not on file  ? Highest education level: Not on file  ?Occupational History  ? Not on file  ?Tobacco Use  ? Smoking status: Never  ? Smokeless tobacco: Never  ?Vaping Use  ? Vaping Use: Never used  ?Substance and Sexual Activity  ? Alcohol use: Yes  ?  Alcohol/week: 3.0 standard drinks  ?  Types: 3 Glasses of wine per week  ? Drug use: Never  ? Sexual activity: Yes  ?  Birth control/protection: Surgical  ?Other Topics Concern  ? Not on file  ?Social History Narrative  ? Not on file  ? ?Social Determinants of Health  ? ?Financial Resource Strain: Not on file  ?Food Insecurity: Not on file  ?Transportation Needs: Not on file  ?Physical Activity: Not on file  ?Stress: Not on file  ?Social Connections: Not on file  ? ? ?No current facility-administered medications on file prior to encounter.  ? ?Current Outpatient Medications on File Prior to Encounter  ?Medication Sig Dispense Refill  ? amoxicillin-clavulanate (AUGMENTIN) 875-125 MG tablet Take 1 tablet by mouth 2 (two) times daily. (Patient not taking: Reported on 02/10/2021) 14 tablet 0  ? amphetamine-dextroamphetamine (ADDERALL) 30 MG tablet Take 30 mg by mouth daily.    ? butalbital-acetaminophen-caffeine (FIORICET, ESGIC) 50-325-40 MG tablet Take by mouth 2 (two) times  daily as needed for headache.    ? doxycycline (VIBRA-TABS) 100 MG tablet Take 100 mg by mouth daily.    ? doxycycline (VIBRAMYCIN) 100 MG capsule Take 1 capsule (100 mg total) by mouth 2 (two) times daily. 20 capsule 0  ? Fremanezumab-vfrm (AJOVY Lamont) Inject into the skin.    ? HYDROcodone-acetaminophen (NORCO/VICODIN) 5-325 MG tablet Take 1-2 tablets by mouth every 6 (six) hours as needed for severe pain. 10 tablet 0  ? ibuprofen (ADVIL,MOTRIN) 200 MG tablet Take 200 mg by mouth every 6 (six) hours as needed.    ? LORazepam (ATIVAN) 1 MG tablet Take 1 mg by mouth every 8 (eight) hours.    ? Mirabegron (MYRBETRIQ PO) Take by mouth.    ? naproxen (NAPROSYN)  500 MG tablet Take 1 tablet (500 mg total) by mouth 2 (two) times daily. 30 tablet 0  ? NON FORMULARY NP thyroid pill-"natural product"    ? NP THYROID 90 MG tablet Take 90 mg by mouth daily.    ? predniSONE (DELTASONE) 20 MG tablet Take 2 tablets daily with breakfast. (Patient not taking: Reported on 02/10/2021) 10 tablet 0  ? progesterone (PROMETRIUM) 200 MG capsule Take 200 mg by mouth daily.    ? spironolactone (ALDACTONE) 100 MG tablet Take 100 mg by mouth daily.    ? tiZANidine (ZANAFLEX) 4 MG tablet Take 1 tablet (4 mg total) by mouth every 8 (eight) hours as needed for muscle spasms. 30 tablet 0  ? triamcinolone cream (KENALOG) 0.1 % Apply 1 application topically 2 (two) times daily. 30 g 0  ? ? ?Review of Systems: a complete, 10pt review of systems was completed with pertinent positives and negatives as documented in the HPI ? ?Physical Exam: ?Vitals:  ? 09/04/21 1602  ?BP: 124/82  ?Pulse: (!) 114  ?Resp: 18  ?Temp: 98.3 ?F (36.8 ?C)  ?SpO2: 98%  ? ?Gen: A&Ox3, in distress ?Eyes: lids and conjunctivae normal, no icterus. Pupils equally round and reactive to light.  ?Neck: supple without mass or thyromegaly ?Chest: respiratory effort is normal. No crepitus or tenderness on palpation of the chest. Breath sounds equal.  ?Cardiovascular: Tachycardic in the 110s but regular with palpable distal pulses, no pedal edema ?Gastrointestinal: soft, nondistended, tender with involuntary guarding in the epigastrium ?Lymphatic: no lymphadenopathy in the neck or groin ?Muscoloskeletal: no clubbing or cyanosis of the fingers.  Strength is symmetrical throughout.  Range of motion of bilateral upper and lower extremities normal without pain, crepitation or contracture. ?Neuro: cranial nerves grossly intact.  Sensation intact to light touch diffusely. ?Psych: appropriate mood and anxious/slightly manic affect with pressured speech and somewhat tangential, normal insight/judgment intact  ?Skin: warm and dry ? ? ? ?  Latest Ref  Rng & Units 09/04/2021  ?  4:09 PM  ?CBC  ?WBC 4.0 - 10.5 K/uL 21.0    ?Hemoglobin 12.0 - 15.0 g/dL 06.3    ?Hematocrit 36.0 - 46.0 % 46.3    ?Platelets 150 - 400 K/uL 360    ? ? ? ?  Latest Ref Rng & Units 09/04/2021  ?  4:09 PM  ?CMP  ?Glucose 70 - 99 mg/dL 016    ?BUN 6 - 20 mg/dL 17    ?Creatinine 0.44 - 1.00 mg/dL 0.10    ?Sodium 135 - 145 mmol/L 137    ?Potassium 3.5 - 5.1 mmol/L 4.9    ?Chloride 98 - 111 mmol/L 101    ?CO2 22 - 32 mmol/L 26    ?  Calcium 8.9 - 10.3 mg/dL 96.2    ?Total Protein 6.5 - 8.1 g/dL 7.7    ?Total Bilirubin 0.3 - 1.2 mg/dL 0.5    ?Alkaline Phos 38 - 126 U/L 77    ?AST 15 - 41 U/L 16    ?ALT 0 - 44 U/L 12    ? ? ?No results found for: INR, PROTIME ? ?Imaging: ?CT Renal Stone Study ? ?Result Date: 09/04/2021 ?CLINICAL DATA:  56 year old female with acute abdominal and pelvic pain. EXAM: CT ABDOMEN AND PELVIS WITHOUT CONTRAST TECHNIQUE: Multidetector CT imaging of the abdomen and pelvis was performed following the standard protocol without IV contrast. RADIATION DOSE REDUCTION: This exam was performed according to the departmental dose-optimization program which includes automated exposure control, adjustment of the mA and/or kV according to patient size and/or use of iterative reconstruction technique. COMPARISON:  None. FINDINGS: Please note that parenchymal and vascular abnormalities may be missed as intravenous contrast was not administered. Lower chest: No acute abnormality. Bilateral breast prosthesis noted. Hepatobiliary: The liver is unremarkable. Slightly increased dependent density within the gallbladder may represent sludge versus cholelithiasis. No CT evidence of acute cholecystitis. Pancreas: Unremarkable Spleen: Unremarkable Adrenals/Urinary Tract: Unremarkable Stomach/Bowel: A focal defect of the anterior distal stomach with adjacent inflammation and small foci of free air are noted, compatible with gastric perforation, likely from an ulcer. There is no evidence of bowel  obstruction. No other bowel abnormalities are identified. The appendix is normal. Vascular/Lymphatic: No significant vascular findings are present. No enlarged abdominal or pelvic lymph nodes. Reproductive: Uter

## 2021-09-04 NOTE — ED Triage Notes (Signed)
Pt reports left lower abd pain that began this morning. Pt states pain 10/10. Denies n/v/d  ?

## 2021-09-05 ENCOUNTER — Encounter (HOSPITAL_COMMUNITY): Payer: Self-pay

## 2021-09-05 ENCOUNTER — Other Ambulatory Visit: Payer: Self-pay

## 2021-09-05 LAB — BASIC METABOLIC PANEL
Anion gap: 8 (ref 5–15)
BUN: 17 mg/dL (ref 6–20)
CO2: 24 mmol/L (ref 22–32)
Calcium: 9.4 mg/dL (ref 8.9–10.3)
Chloride: 104 mmol/L (ref 98–111)
Creatinine, Ser: 0.64 mg/dL (ref 0.44–1.00)
GFR, Estimated: >60 mL/min (ref >60)
Glucose, Bld: 157 mg/dL — ABNORMAL HIGH (ref 70–99)
Potassium: 4.5 mmol/L (ref 3.5–5.1)
Sodium: 136 mmol/L (ref 135–145)

## 2021-09-05 LAB — CBC
HCT: 42.4 % (ref 36.0–46.0)
Hemoglobin: 14.3 g/dL (ref 12.0–15.0)
MCH: 32.3 pg (ref 26.0–34.0)
MCHC: 33.7 g/dL (ref 30.0–36.0)
MCV: 95.7 fL (ref 80.0–100.0)
Platelets: 346 10*3/uL (ref 150–400)
RBC: 4.43 MIL/uL (ref 3.87–5.11)
RDW: 12.5 % (ref 11.5–15.5)
WBC: 24 10*3/uL — ABNORMAL HIGH (ref 4.0–10.5)
nRBC: 0 % (ref 0.0–0.2)

## 2021-09-05 LAB — MAGNESIUM: Magnesium: 1.7 mg/dL (ref 1.7–2.4)

## 2021-09-05 MED ORDER — MIDAZOLAM HCL 2 MG/2ML IJ SOLN
INTRAMUSCULAR | Status: AC
  Filled 2021-09-05: qty 2

## 2021-09-05 MED ORDER — SODIUM CHLORIDE 0.9 % IV SOLN
INTRAVENOUS | Status: DC | PRN
Start: 2021-09-05 — End: 2021-09-09

## 2021-09-05 MED ORDER — MAGNESIUM SULFATE 50 % IJ SOLN
3.0000 g | Freq: Once | INTRAVENOUS | Status: AC
  Administered 2021-09-05: 3 g via INTRAVENOUS
  Filled 2021-09-05: qty 6

## 2021-09-05 NOTE — Progress Notes (Signed)
1 Day Post-Op  ? ?Subjective/Chief Complaint: ?Uneventful night.  Pain has been well controlled. ? ? ?Objective: ?Vital signs in last 24 hours: ?Temp:  [97.2 ?F (36.2 ?C)-98.3 ?F (36.8 ?C)] 97.4 ?F (36.3 ?C) (04/02 0544) ?Pulse Rate:  [78-114] 80 (04/02 0544) ?Resp:  [12-19] 18 (04/02 0544) ?BP: (107-135)/(74-90) 115/83 (04/02 0544) ?SpO2:  [97 %-100 %] 100 % (04/02 0544) ?Weight:  [61.2 kg] 61.2 kg (04/01 1739) ?Last BM Date : 09/04/21 ? ?Intake/Output from previous day: ?04/01 0701 - 04/02 0700 ?In: 2368.2 [I.V.:2118.2; IV Piggyback:250] ?Out: 945 [Urine:700; Emesis/NG output:25; Drains:170; Blood:50] ?Intake/Output this shift: ?Total I/O ?In: 2368.2 [I.V.:2118.2; IV Piggyback:250] ?Out: 51 [Urine:700; Emesis/NG output:25; Drains:170] ? ?Alert, somewhat anxious but appears to be at baseline ?Unlabored respirations ?NG with light bilious output, low volume ?Abdomen is soft, mildly distended, appropriately tender.  Incision clean dry intact with honeycomb.  JP drain is murky serosanguineous ? ?Lab Results:  ?Recent Labs  ?  09/04/21 ?1609 09/05/21 ?0322  ?WBC 21.0* 24.0*  ?HGB 15.9* 14.3  ?HCT 46.3* 42.4  ?PLT 360 346  ? ?BMET ?Recent Labs  ?  09/04/21 ?1609 09/05/21 ?0322  ?NA 137 136  ?K 4.9 4.5  ?CL 101 104  ?CO2 26 24  ?GLUCOSE 152* 157*  ?BUN 17 17  ?CREATININE 0.75 0.64  ?CALCIUM 11.3* 9.4  ? ?PT/INR ?No results for input(s): LABPROT, INR in the last 72 hours. ?ABG ?No results for input(s): PHART, HCO3 in the last 72 hours. ? ?Invalid input(s): PCO2, PO2 ? ?Studies/Results: ?CT Renal Stone Study ? ?Result Date: 09/04/2021 ?CLINICAL DATA:  56 year old female with acute abdominal and pelvic pain. EXAM: CT ABDOMEN AND PELVIS WITHOUT CONTRAST TECHNIQUE: Multidetector CT imaging of the abdomen and pelvis was performed following the standard protocol without IV contrast. RADIATION DOSE REDUCTION: This exam was performed according to the departmental dose-optimization program which includes automated exposure control,  adjustment of the mA and/or kV according to patient size and/or use of iterative reconstruction technique. COMPARISON:  None. FINDINGS: Please note that parenchymal and vascular abnormalities may be missed as intravenous contrast was not administered. Lower chest: No acute abnormality. Bilateral breast prosthesis noted. Hepatobiliary: The liver is unremarkable. Slightly increased dependent density within the gallbladder may represent sludge versus cholelithiasis. No CT evidence of acute cholecystitis. Pancreas: Unremarkable Spleen: Unremarkable Adrenals/Urinary Tract: Unremarkable Stomach/Bowel: A focal defect of the anterior distal stomach with adjacent inflammation and small foci of free air are noted, compatible with gastric perforation, likely from an ulcer. There is no evidence of bowel obstruction. No other bowel abnormalities are identified. The appendix is normal. Vascular/Lymphatic: No significant vascular findings are present. No enlarged abdominal or pelvic lymph nodes. Reproductive: Uterus and bilateral adnexa are unremarkable except for a 4.9 cm simple appearing LEFT adnexal cyst. Other: No ascites. Small foci of free air within the anterior UPPER abdomen and periportal region noted. Musculoskeletal: No acute or suspicious bony abnormalities are noted. Moderate degenerative disc disease/spondylosis at L5-S1 noted. IMPRESSION: 1. Focal defect of the anterior distal stomach with adjacent inflammation and small foci of free air, compatible with gastric perforation, likely from an ulcer. 2. 4.9 cm simple appearing LEFT adnexal cyst. Recommend follow-up US in 6-12 months. Note: This recommendation does not apply to premenarchal patients and to those with increased risk (genetic, family history, elevated tumor markers or other high-risk factors) of ovarian cancer. Reference: JACR 2020 Feb; 17(2):248-254 3. Question gallbladder sludge versus cholelithiasis. No CT evidence of acute cholecystitis. Critical  Value/emergent result was called  by telephone at the time of interpretation on 09/04/2021 at 4:53 pm to provider [facility] , who verbally acknowledged the result. Electronically Signed   By: Margarette Canada M.D.   On: 09/04/2021 16:54   ? ?Anti-infectives: ?Anti-infectives (From admission, onward)  ? ? Start     Dose/Rate Route Frequency Ordered Stop  ? 09/05/21 0000  piperacillin-tazobactam (ZOSYN) IVPB 3.375 g       ? 3.375 g ?12.5 mL/hr over 240 Minutes Intravenous Every 8 hours 09/04/21 1747    ? 09/04/21 1740  piperacillin-tazobactam (ZOSYN) 3.375 GM/50ML IVPB       ?Note to Pharmacy: Hassie Bruce A: cabinet override  ?    09/04/21 1740 09/04/21 1836  ? 09/04/21 1730  piperacillin-tazobactam (ZOSYN) IVPB 3.375 g       ? 3.375 g ?100 mL/hr over 30 Minutes Intravenous  Once 09/04/21 1724 09/04/21 1903  ? ?  ? ? ?Assessment/Plan: ? ?Perforated prepyloric ulcer status post Phillip Heal patch 09/04/21 ?-Continue NG to low intermittent suction, n.p.o. ?-Continue JP drain ?-Continue broad-spectrum antibiotics, PPI drip ?-Await H. Pylori ?-Mobilize, pulmonary toilet ?-Anticipate upper GI postop day 3 or later before progressing ? ? LOS: 1 day  ? ? ?Vanessa Fischer Rich Brave ?09/05/2021 ? ?

## 2021-09-05 NOTE — Plan of Care (Signed)
  Problem: Education: Goal: Knowledge of General Education information will improve Description Including pain rating scale, medication(s)/side effects and non-pharmacologic comfort measures Outcome: Progressing   Problem: Health Behavior/Discharge Planning: Goal: Ability to manage health-related needs will improve Outcome: Progressing   

## 2021-09-06 LAB — CBC
HCT: 41.1 % (ref 36.0–46.0)
Hemoglobin: 13.2 g/dL (ref 12.0–15.0)
MCH: 31.9 pg (ref 26.0–34.0)
MCHC: 32.1 g/dL (ref 30.0–36.0)
MCV: 99.3 fL (ref 80.0–100.0)
Platelets: 321 10*3/uL (ref 150–400)
RBC: 4.14 MIL/uL (ref 3.87–5.11)
RDW: 12.7 % (ref 11.5–15.5)
WBC: 16.1 10*3/uL — ABNORMAL HIGH (ref 4.0–10.5)
nRBC: 0 % (ref 0.0–0.2)

## 2021-09-06 LAB — BASIC METABOLIC PANEL
Anion gap: 7 (ref 5–15)
BUN: 19 mg/dL (ref 6–20)
CO2: 27 mmol/L (ref 22–32)
Calcium: 8.9 mg/dL (ref 8.9–10.3)
Chloride: 102 mmol/L (ref 98–111)
Creatinine, Ser: 0.58 mg/dL (ref 0.44–1.00)
GFR, Estimated: >60 mL/min (ref >60)
Glucose, Bld: 108 mg/dL — ABNORMAL HIGH (ref 70–99)
Potassium: 4.1 mmol/L (ref 3.5–5.1)
Sodium: 136 mmol/L (ref 135–145)

## 2021-09-06 MED ORDER — LIP MEDEX EX OINT
TOPICAL_OINTMENT | CUTANEOUS | Status: DC | PRN
  Filled 2021-09-06: qty 7

## 2021-09-06 MED ORDER — BLISTEX MEDICATED EX OINT
TOPICAL_OINTMENT | CUTANEOUS | Status: DC | PRN
  Filled 2021-09-06: qty 6.3

## 2021-09-06 NOTE — Progress Notes (Signed)
?  Transition of Care (TOC) Screening Note ? ? ?Patient Details  ?Name: Vanessa Fischer ?Date of Birth: [birth year] ? ? ?Transition of Care (TOC) CM/SW Contact:    ?Chynah Orihuela, LCSW ?Phone Number: ?09/06/2021, 3:33 PM ? ? ? ?Transition of Care Department [facility]) has reviewed patient and no TOC needs have been identified at this time. We will continue to monitor patient advancement through interdisciplinary progression rounds. If new patient transition needs arise, please place a TOC consult. ? ? ?

## 2021-09-06 NOTE — Progress Notes (Signed)
2 Days Post-Op  ? ?Subjective/Chief Complaint: ?Having postop abdominal pain but improved with IV pain medications. No nausea or emesis. She denies passing any flatus so far but does not feel distended. 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 No other complaints. ? ?Objective: ?Vital signs in last 24 hours: ?Temp:  [97.7 ?F (36.5 ?C)-99.3 ?F (37.4 ?C)] 98.6 ?F (37 ?C) (04/03 0541) ?Pulse Rate:  [65-85] 77 (04/03 0541) ?Resp:  [15-16] 16 (04/03 0541) ?BP: (115-134)/(71-99) 117/79 (04/03 0541) ?SpO2:  [97 %-100 %] 100 % (04/03 0541) ?Last BM Date : 09/04/21 ? ?Intake/Output from previous day: ?04/02 0701 - 04/03 0700 ?In: 2773.2 [I.V.:2201.4; NG/GT:60; IV Piggyback:511.8] ?Out: [id number] [Urine:900; Emesis/NG output:300; [id number] ?Intake/Output this shift: ?No intake/output data recorded. ? ?PE ?GEN: Alert, NAD ?RESP: Unlabored respirations on room air ?GI: soft, ND, appropriately TTP along incision line. Honeycomb dressing with bloody drainage and dressing removed by me. Staples intact without erythema, no discharge. NG with light bilious output, low volume. JP drain is serosanguineous ? ?Lab Results:  ?Recent Labs  ?  09/05/21 ?0322 09/06/21 ?[id number]  ?WBC 24.0* 16.1*  ?HGB 14.3 13.2  ?HCT 42.4 41.1  ?PLT 346 321  ? ? ?BMET ?Recent Labs  ?  09/05/21 ?0322 09/06/21 ?[id number]  ?NA 136 136  ?K 4.5 4.1  ?CL 104 102  ?CO2 24 27  ?GLUCOSE 157* 108*  ?BUN 17 19  ?CREATININE 0.64 0.58  ?CALCIUM 9.4 8.9  ? ? ?PT/INR ?No results for input(s): LABPROT, INR in the last 72 hours. ?ABG ?No results for input(s): PHART, HCO3 in the last 72 hours. ? ?Invalid input(s): PCO2, PO2 ? ?Studies/Results: ?CT Renal Stone Study ? ?Result Date: 09/04/2021 ?CLINICAL DATA:  56 year old female with acute abdominal and pelvic pain. EXAM: CT ABDOMEN AND PELVIS WITHOUT CONTRAST TECHNIQUE: Multidetector CT imaging of the abdomen and pelvis was performed following the standard protocol without IV contrast. RADIATION DOSE REDUCTION: This exam was performed  according to the departmental dose-optimization program which includes automated exposure control, adjustment of the mA and/or kV according to patient size and/or use of iterative reconstruction technique. COMPARISON:  None. FINDINGS: Please note that parenchymal and vascular abnormalities may be missed as intravenous contrast was not administered. Lower chest: No acute abnormality. Bilateral breast prosthesis noted. Hepatobiliary: The liver is unremarkable. Slightly increased dependent density within the gallbladder may represent sludge versus cholelithiasis. No CT evidence of acute cholecystitis. Pancreas: Unremarkable Spleen: Unremarkable Adrenals/Urinary Tract: Unremarkable Stomach/Bowel: A focal defect of the anterior distal stomach with adjacent inflammation and small foci of free air are noted, compatible with gastric perforation, likely from an ulcer. There is no evidence of bowel obstruction. No other bowel abnormalities are identified. The appendix is normal. Vascular/Lymphatic: No significant vascular findings are present. No enlarged abdominal or pelvic lymph nodes. Reproductive: Uterus and bilateral adnexa are unremarkable except for a 4.9 cm simple appearing LEFT adnexal cyst. Other: No ascites. Small foci of free air within the anterior UPPER abdomen and periportal region noted. Musculoskeletal: No acute or suspicious bony abnormalities are noted. Moderate degenerative disc disease/spondylosis at L5-S1 noted. IMPRESSION: 1. Focal defect of the anterior distal stomach with adjacent inflammation and small foci of free air, compatible with gastric perforation, likely from an ulcer. 2. 4.9 cm simple appearing LEFT adnexal cyst. Recommend follow-up US in 6-12 months. Note: This recommendation does not apply to premenarchal patients and to those with increased risk (genetic, family history, elevated tumor markers or other high-risk factors) of ovarian cancer. Reference: [employer] 2020 Feb;  17(2):248-254 3.  Question gallbladder sludge versus cholelithiasis. No CT evidence of acute cholecystitis. Critical Value/emergent result was called by telephone at the time of interpretation on 09/04/2021 at 4:53 pm to provider [facility] , who verbally acknowledged the result. Electronically Signed   By: Margarette Canada M.D.   On: 09/04/2021 16:54   ? ?Anti-infectives: ?Anti-infectives (From admission, onward)  ? ? Start     Dose/Rate Route Frequency Ordered Stop  ? 09/05/21 0000  piperacillin-tazobactam (ZOSYN) IVPB 3.375 g       ? 3.375 g ?12.5 mL/hr over 240 Minutes Intravenous Every 8 hours 09/04/21 1747    ? 09/04/21 1740  piperacillin-tazobactam (ZOSYN) 3.375 GM/50ML IVPB       ?Note to Pharmacy: Hassie Bruce A: cabinet override  ?    09/04/21 1740 09/04/21 1836  ? 09/04/21 1730  piperacillin-tazobactam (ZOSYN) IVPB 3.375 g       ? 3.375 g ?100 mL/hr over 30 Minutes Intravenous  Once 09/04/21 1724 09/04/21 1903  ? ?  ? ? ?Assessment/Plan: ?Perforated prepyloric ulcer status post Phillip Heal patch 09/04/21 ?-Continue NG to low intermittent suction, n.p.o. ?-Continue JP drain - high output (305 ml) but is SS ?-Continue broad-spectrum antibiotics, PPI drip ?-Await H. Pylori ?-Mobilize, pulmonary toilet ?-Anticipate upper GI postop day 3 or later before progressing ? ?FEN: NPO, NS @ 125 ml/hr ?ID: zosyn 4/2> ?VTE: lovenox ? ? LOS: 2 days  ? ?Winferd Humphrey, PA-C ?[facility] ?09/06/2021, 8:56 AM ?Please see Amion for pager number during day hours 7:00am-4:30pm ? ? ?

## 2021-09-07 ENCOUNTER — Inpatient Hospital Stay (HOSPITAL_COMMUNITY): Payer: Commercial Managed Care - PPO

## 2021-09-07 LAB — BASIC METABOLIC PANEL
Anion gap: 9 (ref 5–15)
BUN: 14 mg/dL (ref 6–20)
CO2: 28 mmol/L (ref 22–32)
Calcium: 8.9 mg/dL (ref 8.9–10.3)
Chloride: 102 mmol/L (ref 98–111)
Creatinine, Ser: 0.64 mg/dL (ref 0.44–1.00)
GFR, Estimated: >60 mL/min (ref >60)
Glucose, Bld: 86 mg/dL (ref 70–99)
Potassium: 3.5 mmol/L (ref 3.5–5.1)
Sodium: 139 mmol/L (ref 135–145)

## 2021-09-07 MED ORDER — SPIRONOLACTONE 100 MG PO TABS
100.0000 mg | ORAL_TABLET | Freq: Every day | ORAL | Status: DC
  Administered 2021-09-07 – 2021-09-09 (×3): 100 mg via ORAL
  Filled 2021-09-07 (×4): qty 1

## 2021-09-07 MED ORDER — BUTALBITAL-APAP-CAFFEINE 50-325-40 MG PO TABS
1.0000 | ORAL_TABLET | ORAL | Status: DC | PRN
  Administered 2021-09-08: 1 via ORAL
  Filled 2021-09-07: qty 1

## 2021-09-07 MED ORDER — LORAZEPAM 0.5 MG PO TABS
0.5000 mg | ORAL_TABLET | Freq: Every day | ORAL | Status: DC | PRN
  Administered 2021-09-07 – 2021-09-08 (×2): 0.5 mg via ORAL
  Filled 2021-09-07 (×2): qty 1

## 2021-09-07 MED ORDER — PROGESTERONE MICRONIZED 100 MG PO CAPS
100.0000 mg | ORAL_CAPSULE | Freq: Every day | ORAL | Status: DC
  Administered 2021-09-08: 100 mg via ORAL
  Filled 2021-09-07 (×2): qty 1

## 2021-09-07 MED ORDER — THYROID 60 MG PO TABS
90.0000 mg | ORAL_TABLET | Freq: Every day | ORAL | Status: DC
  Administered 2021-09-08 – 2021-09-09 (×2): 90 mg via ORAL
  Filled 2021-09-07 (×2): qty 1

## 2021-09-07 MED ORDER — DOCUSATE SODIUM 100 MG PO CAPS
100.0000 mg | ORAL_CAPSULE | Freq: Every day | ORAL | Status: DC
  Administered 2021-09-07 – 2021-09-09 (×3): 100 mg via ORAL
  Filled 2021-09-07 (×3): qty 1

## 2021-09-07 MED ORDER — IOHEXOL 300 MG/ML  SOLN
100.0000 mL | Freq: Once | INTRAMUSCULAR | Status: AC | PRN
  Administered 2021-09-07: 40 mL

## 2021-09-07 MED ORDER — OXYCODONE HCL 5 MG PO TABS
5.0000 mg | ORAL_TABLET | ORAL | Status: DC | PRN
  Administered 2021-09-07: 5 mg via ORAL
  Administered 2021-09-09: 10 mg via ORAL
  Filled 2021-09-07 (×2): qty 2

## 2021-09-07 MED ORDER — ACETAMINOPHEN 500 MG PO TABS: 1000.0000 mg | ORAL_TABLET | Freq: Four times a day (QID) | ORAL | Status: DC

## 2021-09-07 NOTE — Progress Notes (Signed)
3 Days Post-Op  ? ?Subjective/Chief Complaint: ?Stable abdominal pain. No nausea or emesis. Still having some bleeding from midline wound. No recent BM ? ?Objective: ?Vital signs in last 24 hours: ?Temp:  [98.2 ?F (36.8 ?C)-98.7 ?F (37.1 ?C)] 98.5 ?F (36.9 ?C) (04/04 [id number]) ?Pulse Rate:  [70-87] 87 (04/04 0619) ?Resp:  [14-18] 18 (04/04 [id number]) ?BP: (126-148)/(81-96) 130/96 (04/04 [id number]) ?SpO2:  [97 %-100 %] 100 % (04/04 [id number]) ?Last BM Date : 09/04/21 ? ?Intake/Output from previous day: ?04/03 0701 - 04/04 0700 ?In: 1876.6 [I.V.:1572.5; IV Piggyback:304.1] ?Out: 1520 [Urine:700; Emesis/NG output:200; Drains:620] ?Intake/Output this shift: ?Total I/O ?In: 164.2 [I.V.:164.2] ?Out: 370 [Emesis/NG output:300; Drains:70] ? ?PE ?GEN: Alert, NAD ?RESP: Unlabored respirations on room air ?GI: soft, ND, appropriately TTP along incision line. Dressing c/d/I and removed for exam - staples intact and no surrounding induration or erythema. There is old clotted blood at umbilical portion of incision and evidence of recent bleeding on dressing. No active bleeding at time of my exam. NG with light bilious output, low volume. JP drain is serosanguineous ? ?Lab Results:  ?Recent Labs  ?  09/05/21 ?0322 09/06/21 ?[id number]  ?WBC 24.0* 16.1*  ?HGB 14.3 13.2  ?HCT 42.4 41.1  ?PLT 346 321  ? ? ?BMET ?Recent Labs  ?  09/06/21 ?0307 09/07/21 ?0356  ?NA 136 139  ?K 4.1 3.5  ?CL 102 102  ?CO2 27 28  ?GLUCOSE 108* 86  ?BUN 19 14  ?CREATININE 0.58 0.64  ?CALCIUM 8.9 8.9  ? ? ?PT/INR ?No results for input(s): LABPROT, INR in the last 72 hours. ?ABG ?No results for input(s): PHART, HCO3 in the last 72 hours. ? ?Invalid input(s): PCO2, PO2 ? ?Studies/Results: ?No results found. ? ?Anti-infectives: ?Anti-infectives (From admission, onward)  ? ? Start     Dose/Rate Route Frequency Ordered Stop  ? 09/05/21 0000  piperacillin-tazobactam (ZOSYN) IVPB 3.375 g       ? 3.375 g ?12.5 mL/hr over 240 Minutes Intravenous Every 8 hours 09/04/21 1747    ? 09/04/21 1740   piperacillin-tazobactam (ZOSYN) 3.375 GM/50ML IVPB       ?Note to Pharmacy: Hassie Bruce A: cabinet override  ?    09/04/21 1740 09/04/21 1836  ? 09/04/21 1730  piperacillin-tazobactam (ZOSYN) IVPB 3.375 g       ? 3.375 g ?100 mL/hr over 30 Minutes Intravenous  Once 09/04/21 1724 09/04/21 1903  ? ?  ? ? ?Assessment/Plan: ?Perforated prepyloric ulcer  ?POD 3 status post Phillip Heal patch 09/04/21 Dr. Kae Heller ?-UGI today without evidence of extravasation. Discontinue NGT and start CLD ?-Continue JP drain - high output (620 ml) but is SS ?-Continue broad-spectrum antibiotics, PPI drip ?-Await H. Pylori ?-Mobilize, pulmonary toilet ?- Add PO meds including home meds ?- She takes a magnesium tablet for management of her IBS-C - will hold off on this for now but add daily colace ? ?FEN: CLD, dec IVF - NS @ 50 ml/hr ?ID: zosyn 4/2> ?VTE: lovenox ? ? LOS: 3 days  ? ?Winferd Humphrey, PA-C ?[facility] ?09/07/2021, 9:37 AM ?Please see Amion for pager number during day hours 7:00am-4:30pm ? ? ?

## 2021-09-08 LAB — BASIC METABOLIC PANEL
Anion gap: 8 (ref 5–15)
BUN: 7 mg/dL (ref 6–20)
CO2: 27 mmol/L (ref 22–32)
Calcium: 8.5 mg/dL — ABNORMAL LOW (ref 8.9–10.3)
Chloride: 102 mmol/L (ref 98–111)
Creatinine, Ser: 0.45 mg/dL (ref 0.44–1.00)
GFR, Estimated: >60 mL/min (ref >60)
Glucose, Bld: 135 mg/dL — ABNORMAL HIGH (ref 70–99)
Potassium: 3.3 mmol/L — ABNORMAL LOW (ref 3.5–5.1)
Sodium: 137 mmol/L (ref 135–145)

## 2021-09-08 LAB — CBC
HCT: 41.7 % (ref 36.0–46.0)
Hemoglobin: 13.5 g/dL (ref 12.0–15.0)
MCH: 31.5 pg (ref 26.0–34.0)
MCHC: 32.4 g/dL (ref 30.0–36.0)
MCV: 97.2 fL (ref 80.0–100.0)
Platelets: 365 10*3/uL (ref 150–400)
RBC: 4.29 MIL/uL (ref 3.87–5.11)
RDW: 12.1 % (ref 11.5–15.5)
WBC: 9.7 10*3/uL (ref 4.0–10.5)
nRBC: 0 % (ref 0.0–0.2)

## 2021-09-08 LAB — H PYLORI, IGM, IGG, IGA AB
H Pylori IgG: 0.06 Index Value (ref 0.00–0.79)
H. Pylogi, Iga Abs: <9 units (ref 0.0–8.9)
H. Pylogi, Igm Abs: <9 units (ref 0.0–8.9)

## 2021-09-08 LAB — MAGNESIUM: Magnesium: 1.9 mg/dL (ref 1.7–2.4)

## 2021-09-08 MED ORDER — POTASSIUM CHLORIDE 10 MEQ/100ML IV SOLN
10.0000 meq | INTRAVENOUS | Status: AC
  Administered 2021-09-08 (×3): 10 meq via INTRAVENOUS
  Filled 2021-09-08: qty 100

## 2021-09-08 MED ORDER — PANTOPRAZOLE SODIUM 40 MG PO TBEC
40.0000 mg | DELAYED_RELEASE_TABLET | Freq: Two times a day (BID) | ORAL | Status: DC
  Administered 2021-09-08 – 2021-09-09 (×2): 40 mg via ORAL
  Filled 2021-09-08 (×2): qty 1

## 2021-09-08 MED ORDER — HYDROMORPHONE HCL 1 MG/ML IJ SOLN
0.5000 mg | INTRAMUSCULAR | Status: DC | PRN
  Administered 2021-09-09: 0.5 mg via INTRAVENOUS
  Filled 2021-09-08: qty 0.5

## 2021-09-08 NOTE — Progress Notes (Signed)
4 Days Post-Op  ? ?Subjective/Chief Complaint: ?Having expected post op abdominal pain without worsening or nausea/emesis with clear liquids. Passing flatus. No BM for several days ? ?Objective: ?Vital signs in last 24 hours: ?Temp:  [97.7 ?F (36.5 ?C)-98.5 ?F (36.9 ?C)] 98.1 ?F (36.7 ?C) (04/05 0518) ?Pulse Rate:  [74-80] 77 (04/05 0518) ?Resp:  [14-16] 16 (04/05 0518) ?BP: (120-125)/(63-89) 120/89 (04/05 0518) ?SpO2:  [97 %-100 %] 100 % (04/05 0518) ?Last BM Date : 09/03/21 ? ?Intake/Output from previous day: ?04/04 0701 - 04/05 0700 ?In: 1326.7 [P.O.:290; I.V.:913.1; IV Piggyback:123.6] ?Out: 820 [Emesis/NG output:500; Drains:320] ?Intake/Output this shift: ?No intake/output data recorded. ? ?PE ?GEN: Alert, NAD ?RESP: Unlabored respirations on room air ?GI: soft, ND, appropriately TTP along incision line. Dressing c/d/I and removed for exam - staples intact and no surrounding induration or erythema, no further bleeding noted.. JP drain is serosanguineous ? ?Lab Results:  ?Recent Labs  ?  09/06/21 ?[id number] 09/08/21 ?0405  ?WBC 16.1* 9.7  ?HGB 13.2 13.5  ?HCT 41.1 41.7  ?PLT 321 365  ? ? ?BMET ?Recent Labs  ?  09/07/21 ?0356 09/08/21 ?0405  ?NA 139 137  ?K 3.5 3.3*  ?CL 102 102  ?CO2 28 27  ?GLUCOSE 86 135*  ?BUN 14 7  ?CREATININE 0.64 0.45  ?CALCIUM 8.9 8.5*  ? ? ?PT/INR ?No results for input(s): LABPROT, INR in the last 72 hours. ?ABG ?No results for input(s): PHART, HCO3 in the last 72 hours. ? ?Invalid input(s): PCO2, PO2 ? ?Studies/Results: ?DG UGI W SINGLE CM (SOL OR THIN BA) ? ?Result Date: 09/07/2021 ?CLINICAL DATA:  Patient status post exploratory laparotomy, omental pedicle flap/Graham patch closure perforated pre-pyloric ulcer. EXAM: DG UGI W SINGLE CM TECHNIQUE: Scout radiograph was obtained. Fluoroscopic examination was performed using water-soluble contrast. This exam was performed by Narda Rutherford, NP, and was supervised and interpreted by CT scan 09/04/2021. FLUOROSCOPY: Radiation Exposure Index (as  provided by the fluoroscopic device): 1.8 mGy Kerma COMPARISON:  NONE. FINDINGS: Scout Radiograph:  Normal. Esophagus: Deferred Esophageal motility: Deferred Gastroesophageal reflux: Deferred Stomach: Normal appearance. No hiatal hernia. Gastric emptying: Normal transit of contrast. Duodenum: Normal appearance. No contrast extravasation/leak is identified. Other:  None. IMPRESSION: Normal examination of the stomach and duodenum. No contrast extravasation/leak is identified. Electronically Signed   By: Marijo Sanes M.D.   On: 09/07/2021 09:41   ? ?Anti-infectives: ?Anti-infectives (From admission, onward)  ? ? Start     Dose/Rate Route Frequency Ordered Stop  ? 09/05/21 0000  piperacillin-tazobactam (ZOSYN) IVPB 3.375 g       ? 3.375 g ?12.5 mL/hr over 240 Minutes Intravenous Every 8 hours 09/04/21 1747    ? 09/04/21 1740  piperacillin-tazobactam (ZOSYN) 3.375 GM/50ML IVPB       ?Note to Pharmacy: Hassie Bruce A: cabinet override  ?    09/04/21 1740 09/04/21 1836  ? 09/04/21 1730  piperacillin-tazobactam (ZOSYN) IVPB 3.375 g       ? 3.375 g ?100 mL/hr over 30 Minutes Intravenous  Once 09/04/21 1724 09/04/21 1903  ? ?  ? ? ?Assessment/Plan: ?Perforated prepyloric ulcer  ?POD 4 status post Phillip Heal patch 09/04/21 Dr. Kae Heller ?-UGI 4/4 without evidence of extravasation.  ?-Continue JP drain for 1 week- output 320 ml/24 h, remains SS ?-Continue broad-spectrum antibiotics, bid PPI ?-Await H. Pylori ?-Mobilize, pulmonary toilet ?- Added PO meds including home meds and Fioricet prn for migraines. ?- She takes a magnesium tablet for management of her IBS-C - will hold off on this  for now. Cont daily colace ?- advance to FLD ? ?FEN: FLD, SLIV, hypokalemia - replete IV ?ID: zosyn 4/2> ?VTE: lovenox ? ? LOS: 4 days  ? ?Vanessa Humphrey, PA-C ?Vanessa Fischer ?09/08/2021, 10:36 AM ?Please see Amion for pager number during day hours 7:00am-4:30pm ? ? ?

## 2021-09-09 LAB — BASIC METABOLIC PANEL
Anion gap: 9 (ref 5–15)
BUN: <5 mg/dL — ABNORMAL LOW (ref 6–20)
CO2: 26 mmol/L (ref 22–32)
Calcium: 8.9 mg/dL (ref 8.9–10.3)
Chloride: 104 mmol/L (ref 98–111)
Creatinine, Ser: 0.59 mg/dL (ref 0.44–1.00)
GFR, Estimated: >60 mL/min (ref >60)
Glucose, Bld: 122 mg/dL — ABNORMAL HIGH (ref 70–99)
Potassium: 3.4 mmol/L — ABNORMAL LOW (ref 3.5–5.1)
Sodium: 139 mmol/L (ref 135–145)

## 2021-09-09 LAB — MAGNESIUM: Magnesium: 1.9 mg/dL (ref 1.7–2.4)

## 2021-09-09 MED ORDER — ONDANSETRON 4 MG PO TBDP: 4.0000 mg | ORAL_TABLET | Freq: Four times a day (QID) | ORAL | 0 refills | Status: DC | PRN

## 2021-09-09 MED ORDER — OXYCODONE HCL 5 MG PO TABS: 5.0000 mg | ORAL_TABLET | Freq: Four times a day (QID) | ORAL | 0 refills | Status: DC | PRN

## 2021-09-09 MED ORDER — POTASSIUM CHLORIDE 10 MEQ/100ML IV SOLN
10.0000 meq | INTRAVENOUS | Status: DC
  Administered 2021-09-09: 10 meq via INTRAVENOUS
  Filled 2021-09-09: qty 100

## 2021-09-09 MED ORDER — POTASSIUM CHLORIDE CRYS ER 20 MEQ PO TBCR
20.0000 meq | EXTENDED_RELEASE_TABLET | Freq: Two times a day (BID) | ORAL | Status: DC
  Administered 2021-09-09: 20 meq via ORAL
  Filled 2021-09-09: qty 1

## 2021-09-09 MED ORDER — METHOCARBAMOL 500 MG PO TABS: 500.0000 mg | ORAL_TABLET | Freq: Four times a day (QID) | ORAL | 0 refills | Status: DC | PRN

## 2021-09-09 MED ORDER — PANTOPRAZOLE SODIUM 40 MG PO TBEC: 40.0000 mg | DELAYED_RELEASE_TABLET | Freq: Two times a day (BID) | ORAL | 1 refills | Status: AC

## 2021-09-09 NOTE — Progress Notes (Signed)
5 Days Post-Op  ? ?Subjective/Chief Complaint: ?Pain well controlled and tolerating full liquids well. Has had 2 bowel movements. No nausea or emesis. Ambulating ? ?Objective: ?Vital signs in last 24 hours: ?Temp:  [98.3 ?F (36.8 ?C)-99.3 ?F (37.4 ?C)] 98.3 ?F (36.8 ?C) (04/06 8832) ?Pulse Rate:  [75-86] 75 (04/06 0511) ?Resp:  [17-18] 17 (04/06 0511) ?BP: (98-125)/(69-79) 98/69 (04/06 5498) ?SpO2:  [97 %-99 %] 97 % (04/06 0511) ?Last BM Date : 09/09/21 ? ?Intake/Output from previous day: ?04/05 0701 - 04/06 0700 ?In: 1808.5 [P.O.:1440; I.V.:160; IV Piggyback:208.5] ?Out: 281 [Drains:280; Stool:1] ?Intake/Output this shift: ?Total I/O ?In: -  ?Out: 30 [Drains:30] ? ?PE ?GEN: Alert, NAD ?RESP: Unlabored respirations on room air ?GI: soft, ND, appropriately TTP along incision line. Midline incision with staples intact and no surrounding induration or erythema, no further bleeding noted.. JP drain is clear serous ? ?Lab Results:  ?Recent Labs  ?  09/08/21 ?0405  ?WBC 9.7  ?HGB 13.5  ?HCT 41.7  ?PLT 365  ? ? ?BMET ?Recent Labs  ?  09/08/21 ?0405 09/09/21 ?0348  ?NA 137 139  ?K 3.3* 3.4*  ?CL 102 104  ?CO2 27 26  ?GLUCOSE 135* 122*  ?BUN 7 <5*  ?CREATININE 0.45 0.59  ?CALCIUM 8.5* 8.9  ? ? ?PT/INR ?No results for input(s): LABPROT, INR in the last 72 hours. ?ABG ?No results for input(s): PHART, HCO3 in the last 72 hours. ? ?Invalid input(s): PCO2, PO2 ? ?Studies/Results: ?No results found. ? ?Anti-infectives: ?Anti-infectives (From admission, onward)  ? ? Start     Dose/Rate Route Frequency Ordered Stop  ? 09/05/21 0000  piperacillin-tazobactam (ZOSYN) IVPB 3.375 g       ? 3.375 g ?12.5 mL/hr over 240 Minutes Intravenous Every 8 hours 09/04/21 1747    ? 09/04/21 1740  piperacillin-tazobactam (ZOSYN) 3.375 GM/50ML IVPB       ?Note to Pharmacy: Mortimer Fries A: cabinet override  ?    09/04/21 1740 09/04/21 1836  ? 09/04/21 1730  piperacillin-tazobactam (ZOSYN) IVPB 3.375 g       ? 3.375 g ?100 mL/hr over 30 Minutes  Intravenous  Once 09/04/21 1724 09/04/21 1903  ? ?  ? ? ?Assessment/Plan: ?Perforated prepyloric ulcer  ?POD 5 status post Cheree Ditto patch 09/04/21 Dr. Fredricka Bonine ?-UGI 4/4 without evidence of extravasation.  ?-Continue JP drain for 1 week- remains SS ?-Continue broad-spectrum antibiotics, bid PPI ?-H. Pylori negative ?- Mobilize, pulmonary toilet ?- Added PO meds including home meds and Fioricet prn for migraines. ?- She takes a magnesium tablet for management of her IBS-C - will hold off on this for now. Cont daily colace ?- advance to soft ? ?FEN: soft, SLIV, hypokalemia - replete PO ?ID: zosyn 4/2> ?VTE: lovenox ? ?Dispo: home this afternoon ? ? LOS: 5 days  ? ?Eric Form, PA-C ?[facility] ?09/09/2021, 9:57 AM ?Please see Amion for pager number during day hours 7:00am-4:30pm ? ? ?

## 2021-09-09 NOTE — Discharge Summary (Signed)
[facility] ?Discharge Summary  ? ?Patient ID: ?Vanessa Fischer ?MRN: [id number] ?DOB/AGE: Jan 12, 1966 56 y.o. ? ?Admit date: 09/04/2021 ?Discharge date: 09/09/2021 ? ?Admitting Diagnosis: ?Perforated viscus  ? ?Discharge Diagnosis ?Perforated prepyloric ulcer ? ?Consultants ?None ? ?Imaging: ?No results found. ? ?Procedures ?Dr. Kae Heller (09/04/2021) - Exploratory laparotomy, omental pedicle flap/Graham patch closure of perforated prepyloric ulcer ? ?Hospital Course:  ?Vanessa Fischer is a 56yo female who presented to [facility] 4/1 with worsening left upper abdominal pain.  Perforated viscus noted on CT. Patient was taken emergently to the operating room where she was found to have a Perforated prepyloric ulcer. Patient underwent procedure listed above.  Tolerated procedure well and was transferred to the floor.  UGI obtained POD#3 and negative for leak. NG was removed and diet advanced as tolerated. H pylori was negative. She completed 5 days of antibiotics. On POD5, the patient was voiding well, tolerating diet, ambulating well, pain well controlled, vital signs stable, incisions c/d/i and felt stable for discharge home.  Patient will follow up as below and knows to call with questions or concerns.   ? ? ?Allergies as of 09/09/2021   ?No Known Allergies ?  ? ?  ?Medication List  ?  ? ?STOP taking these medications   ? ?tiZANidine 4 MG tablet ?Commonly known as: Zanaflex ?  ?triamcinolone cream 0.1 % ?Commonly known as: KENALOG ?  ? ?  ? ?TAKE these medications   ? ?Ajovy 225 MG/1.5ML Sosy ?Generic drug: Fremanezumab-vfrm ?Inject into the skin. ?  ?LORazepam 1 MG tablet ?Commonly known as: ATIVAN ?Take 0.5 mg by mouth daily as needed for anxiety. ?  ?magnesium 30 MG tablet ?Take 60 mg by mouth at bedtime. ?  ?methocarbamol 500 MG tablet ?Commonly known as: Robaxin ?Take 1 tablet (500 mg total) by mouth every 6 (six) hours as needed for muscle spasms. ?  ?NP Thyroid 30 MG tablet ?Generic drug: thyroid ?Take 90 mg by mouth  daily. ?  ?oxyCODONE 5 MG immediate release tablet ?Commonly known as: Oxy IR/ROXICODONE ?Take 1 tablet (5 mg total) by mouth every 6 (six) hours as needed for severe pain. ?  ?pantoprazole 40 MG tablet ?Commonly known as: PROTONIX ?Take 1 tablet (40 mg total) by mouth 2 (two) times daily. ?  ?progesterone 100 MG capsule ?Commonly known as: PROMETRIUM ?Take 100 mg by mouth at bedtime. ?  ?spironolactone 100 MG tablet ?Commonly known as: ALDACTONE ?Take 100 mg by mouth daily. ?  ?Vyvanse 50 MG Chew ?Generic drug: Lisdexamfetamine Dimesylate ?Chew 25 mg by mouth daily. ?  ? ?  ? ? ? ? Follow-up Information   ? ? [facility], [place]. Go on 09/14/2021.   ?Specialty: General Surgery ?Why: Your appointment is 4/11 at 2:45pm for drain and staple removal ?Please arrive 30 minutes prior to your appointment to check in and fill out paperwork. Bring photo ID and insurance information. ?Contact information: ?[address] ?[address] ?[place] ?[phone] ? ?  ?  ? ? Clovis Riley, MD. Daphane Shepherd on 10/06/2021.   ?Specialty: General Surgery ?Why: Your appointment is 5/3 at 11:40am ?Please arrive 15 minutes early to check in. ?Contact information: ?[address] ?[address] ?[place] [address] ?[phone] ? ? ?  ?  ? ?  ?  ? ?  ? ? ? ?Signed: ?Wellington Hampshire, PA-C ?[facility] ?09/09/2021, 1:11 PM ?Please see Amion for pager number during day hours 7:00am-4:30pm ? ?

## 2021-09-09 NOTE — Discharge Instructions (Signed)
[facility]      [facility] [phone]  OPEN ABDOMINAL SURGERY: POST OP INSTRUCTIONS  Always review your discharge instruction sheet given to you by the facility where your surgery was performed.  IF YOU HAVE DISABILITY OR FAMILY LEAVE FORMS, YOU MUST BRING THEM TO THE OFFICE FOR PROCESSING.  PLEASE DO NOT GIVE THEM TO YOUR DOCTOR.  A prescription for pain medication may be given to you upon discharge.  Take your pain medication as prescribed, if needed.  If narcotic pain medicine is not needed, then you may take acetaminophen (Tylenol) or ibuprofen (Advil) as needed. Take your usually prescribed medications unless otherwise directed. If you need a refill on your pain medication, please contact your pharmacy. They will contact our office to request authorization.  Prescriptions will not be filled after 5pm or on week-ends. You should follow a light diet the first few days after arrival home, such as soup and crackers, pudding, etc.unless your doctor has advised otherwise. A high-fiber, low fat diet can be resumed as tolerated.   Be sure to include lots of fluids daily. Most patients will experience some swelling and bruising on the chest and neck area.  Ice packs will help.  Swelling and bruising can take several days to resolve Most patients will experience some swelling and bruising in the area of the incision. Ice pack will help. Swelling and bruising can take several days to resolve..  It is common to experience some constipation if taking pain medication after surgery.  Increasing fluid intake and taking a stool softener will usually help or prevent this problem from occurring.  A mild laxative (Milk of Magnesia or Miralax) should be taken according to package directions if there are no bowel movements after 48 hours.  You may have steri-strips (small skin tapes) in place directly over the incision.  These strips should be left on the skin for 7-10 days.  If your surgeon used skin  glue on the incision, you may shower in 24 hours.  The glue will flake off over the next 2-3 weeks.  Any sutures or staples will be removed at the office during your follow-up visit. You may find that a light gauze bandage over your incision may keep your staples from being rubbed or pulled. You may shower and replace the bandage daily. ACTIVITIES:  You may resume regular (light) daily activities beginning the next day--such as daily self-care, walking, climbing stairs--gradually increasing activities as tolerated.  You may have sexual intercourse when it is comfortable.  Refrain from any heavy lifting or straining until approved by your doctor. You may drive when you no longer are taking prescription pain medication, you can comfortably wear a seatbelt, and you can safely maneuver your car and apply brakes Return to Work: ___________________________________ You should see your doctor in the office for a follow-up appointment approximately two weeks after your surgery.  Make sure that you call for this appointment within a day or two after you arrive home to insure a convenient appointment time. OTHER INSTRUCTIONS:  _____________________________________________________________ _____________________________________________________________  WHEN TO CALL YOUR DOCTOR: Fever over 101.0 Inability to urinate Nausea and/or vomiting Extreme swelling or bruising Continued bleeding from incision. Increased pain, redness, or drainage from the incision. Difficulty swallowing or breathing Muscle cramping or spasms. Numbness or tingling in hands or feet or around lips.  The clinic staff is available to answer your questions during regular business hours.  Please don't hesitate to call and ask to speak to one of   the nurses if you have concerns.  For further questions, please visit [email or web address]  

## 2021-09-09 NOTE — Progress Notes (Signed)
Patients right AC #20 IV infiltrated near the end of potassium infusion. Arm is swollen and pink. Arm elevated on pillows and ice pack given. Patient denies pain to the area. Will monitor.  ?

## 2021-10-22 ENCOUNTER — Encounter: Payer: Self-pay | Admitting: Physician Assistant

## 2021-11-11 NOTE — Progress Notes (Unsigned)
11/11/2021 Vanessa Fischer [id number] [birth year]  Referring provider: Collene Mares, PA Primary GI doctor: {acdocs:27040}  ASSESSMENT AND PLAN:   There are no diagnoses linked to this encounter.   Patient Care Team: Collene Mares, [place] as PCP - General (Internal Medicine)  HISTORY OF PRESENT ILLNESS: 56 y.o. female with a past medical history of migraines, ADHD, and others listed below, presents for hospital follow up.  Patient was in the hospital from 09/04/21 to 09/09/21, was in the hospital for perforated prepyloric ulcer. 09/04/2021 patient presented to ER with left upper abdominal pain, prodrome of 1 months with early satiety, nausea abdominal pain in setting of alcohol NSAID use 09/04/2021 CT abdomen pelvis noted perforated viscus 09/04/2021 exploratory laparotomy with omental pedicle flap/Graham patch closure of perforated prepyloric ulcer with Dr. Doylene Canard 09/07/2021 UGI POD #3 negative for leak H. pylori negative  09/04/21 CT renal stone  Current Medications:   Current Outpatient Medications (Endocrine & Metabolic):    NP THYROID 30 MG tablet, Take 90 mg by mouth daily.   progesterone (PROMETRIUM) 100 MG capsule, Take 100 mg by mouth at bedtime.  Current Outpatient Medications (Cardiovascular):    spironolactone (ALDACTONE) 100 MG tablet, Take 100 mg by mouth daily.   Current Outpatient Medications (Analgesics):    AJOVY 225 MG/1.5ML SOSY, Inject into the skin.   oxyCODONE (OXY IR/ROXICODONE) 5 MG immediate release tablet, Take 1 tablet (5 mg total) by mouth every 6 (six) hours as needed for severe pain.   Current Outpatient Medications (Other):    LORazepam (ATIVAN) 1 MG tablet, Take 0.5 mg by mouth daily as needed for anxiety.   magnesium 30 MG tablet, Take 60 mg by mouth at bedtime.   methocarbamol (ROBAXIN) 500 MG tablet, Take 1 tablet (500 mg total) by mouth every 6 (six) hours as needed for muscle spasms.   ondansetron (ZOFRAN-ODT) 4 MG disintegrating  tablet, Take 1 tablet (4 mg total) by mouth every 6 (six) hours as needed for nausea.   pantoprazole (PROTONIX) 40 MG tablet, Take 1 tablet (40 mg total) by mouth 2 (two) times daily.   VYVANSE 50 MG CHEW, Chew 25 mg by mouth daily.  Medical History: No past medical history on file. Allergies: No Known Allergies   Surgical History:  She  has a past surgical history that includes Myomectomy; Cesarean section; Scar revision; Breast enhancement surgery; Tubal ligation; Breast surgery; Cosmetic surgery; Bunionectomy; bone spurs; Augmentation mammaplasty (Bilateral, 2004); Augmentation mammaplasty (Bilateral, 2010); Augmentation mammaplasty (Bilateral, 2014); and laparotomy (N/A, 09/04/2021). Family History:  Her family history includes Diabetes in her mother; Hypertension in her father. Social History:   reports that she has never smoked. She has never used smokeless tobacco. She reports current alcohol use of about 3.0 standard drinks of alcohol per week. She reports that she does not use drugs.  REVIEW OF SYSTEMS  : All other systems reviewed and negative except where noted in the History of Present Illness.   PHYSICAL EXAM: There were no vitals taken for this visit. General:   Pleasant, well developed female in no acute distress Head:   Normocephalic and atraumatic. Eyes:  {sclerae:26738},conjunctive {conjuctiva:26739}  Heart:   {HEART EXAM HEM/ONC:21750} Pulm:  Clear anteriorly; no wheezing Abdomen:   {BlankSingle:19197::"Distended","Ridged","Soft"}, {BlankSingle:19197::"Flat","Obese","Non-distended"} AB, {BlankSingle:19197::"Absent","Hyperactive, tinkling","Hypoactive","Sluggish","Active"} bowel sounds. {actendernessAB:27319} tenderness {anatomy; site abdomen:5010}. {BlankMultiple:19196::"Without guarding","With guarding","Without rebound","With rebound"}, No organomegaly appreciated. Rectal: {acrectalexam:27461} Extremities:  {With/Without:304960234} edema. Msk: Symmetrical without gross  deformities. Peripheral pulses intact.  Neurologic:  Alert and  oriented x4;  No focal deficits.  Skin:   Dry and intact without significant lesions or rashes. Psychiatric:  Cooperative. Normal mood and affect.    Doree Albee, PA-C 10:07 AM

## 2021-11-15 ENCOUNTER — Ambulatory Visit (INDEPENDENT_AMBULATORY_CARE_PROVIDER_SITE_OTHER): Payer: Commercial Managed Care - PPO | Admitting: Physician Assistant

## 2021-11-15 ENCOUNTER — Ambulatory Visit
Admission: RE | Admit: 2021-11-15 | Discharge: 2021-11-15 | Disposition: A | Discharge status: Home / Self Care | Payer: Commercial Managed Care - PPO | Source: Ambulatory Visit | Attending: Internal Medicine | Admitting: Internal Medicine

## 2021-11-15 ENCOUNTER — Other Ambulatory Visit: Payer: Self-pay | Admitting: Internal Medicine

## 2021-11-15 ENCOUNTER — Ambulatory Visit (INDEPENDENT_AMBULATORY_CARE_PROVIDER_SITE_OTHER)
Admission: RE | Admit: 2021-11-15 | Discharge: 2021-11-15 | Disposition: A | Discharge status: Home / Self Care | Payer: Commercial Managed Care - PPO | Source: Ambulatory Visit | Attending: Physician Assistant | Admitting: Physician Assistant

## 2021-11-15 ENCOUNTER — Encounter: Payer: Self-pay | Admitting: Physician Assistant

## 2021-11-15 VITALS — BP 98/64 | HR 96 | Ht 64.25 in | Wt 130.4 lb

## 2021-11-15 DIAGNOSIS — K581 Irritable bowel syndrome with constipation: Secondary | ICD-10-CM

## 2021-11-15 DIAGNOSIS — R198 Other specified symptoms and signs involving the digestive system and abdomen: Secondary | ICD-10-CM

## 2021-11-15 DIAGNOSIS — Z1211 Encounter for screening for malignant neoplasm of colon: Secondary | ICD-10-CM

## 2021-11-15 DIAGNOSIS — K219 Gastro-esophageal reflux disease without esophagitis: Secondary | ICD-10-CM

## 2021-11-15 DIAGNOSIS — R1012 Left upper quadrant pain: Secondary | ICD-10-CM | POA: Diagnosis not present

## 2021-11-15 DIAGNOSIS — M79642 Pain in left hand: Secondary | ICD-10-CM

## 2021-11-15 MED ORDER — NA SULFATE-K SULFATE-MG SULF 17.5-3.13-1.6 GM/177ML PO SOLN: 1.0000 | Freq: Once | ORAL | 0 refills | Status: AC

## 2021-11-15 NOTE — Progress Notes (Signed)
I agree with the assessment and plan as outlined by Ms. Collier. 

## 2021-11-15 NOTE — Patient Instructions (Addendum)
If you are age 54 or younger, your body mass index should be between 19-25. Your Body mass index is 22.21 kg/m. If this is out of the aformentioned range listed, please consider follow up with your Primary Care Provider.  ________________________________________________________  The [facility] providers would like to encourage you to use [facility] to communicate with providers for non-urgent requests or questions.  Due to long hold times on the telephone, sending your provider a message by [facility] may be a faster and more efficient way to get a response.  Please allow 48 business hours for a response.  Please remember that this is for non-urgent requests.  _______________________________________________________  Vanessa Fischer have been scheduled for an endoscopy and colonoscopy. Please follow the written instructions given to you at your visit today. Please pick up your prep supplies at the pharmacy within the next 1-3 days. If you use inhalers (even only as needed), please bring them with you on the day of your procedure.  Your provider has requested that you go to the basement level for an Xray of your abdomen before leaving today.  Press "B" on the elevator.  The lab is located at the first door on the left as you exit the elevator.  Follow up pending.  Thank you for entrusting me with your care and choosing [facility].  Vanessa Mutters, PA-C  Please take your proton pump inhibitor medication, continue twice a day , 30 minutes to 1 hour before meals - this makes it more effective.  Avoid spicy and acidic foods Avoid fatty foods Limit your intake of coffee, tea, alcohol, and carbonated drinks Work to maintain a healthy weight Keep the head of the bed elevated at least 3 inches with blocks or a wedge pillow if you are having any nighttime symptoms Stay upright for 2 hours after eating Avoid meals and snacks three to four hours before bedtime No NSAIDS, NO goody powders  MAXIMUM AMOUNT  OF TYLENOL IN A DAY  You can take tylenol (500mg ) or tylenol arthritis (650mg ) with the meloxicam/antiinflammatories. The max you can take of tylenol a day is 3000mg  daily, this is a max of 6 pills a day of the regular tyelnol (500mg ) or a max of 4 a day of the tylenol arthritis (650mg ) as long as no other medications you are taking contain tylenol.  You can also use voltern gel topical or salon pas patches for pain.

## 2021-12-19 ENCOUNTER — Encounter: Payer: Self-pay | Admitting: Certified Registered Nurse Anesthetist

## 2021-12-23 ENCOUNTER — Encounter: Payer: Commercial Managed Care - PPO | Admitting: Internal Medicine

## 2021-12-23 ENCOUNTER — Telehealth: Payer: Self-pay | Admitting: Internal Medicine

## 2021-12-23 NOTE — Telephone Encounter (Signed)
Called patient at 2:15-- No answer. Left message on phone to have the patient call back to let us know if she is coming in or needs to R/S.

## 2022-02-18 ENCOUNTER — Other Ambulatory Visit (HOSPITAL_BASED_OUTPATIENT_CLINIC_OR_DEPARTMENT_OTHER): Payer: Self-pay

## 2022-02-18 MED ORDER — LISDEXAMFETAMINE DIMESYLATE 50 MG PO CHEW
1.0000 | CHEWABLE_TABLET | Freq: Every day | ORAL | 0 refills | Status: DC
Start: 2022-02-18 — End: 2022-12-22
  Filled 2022-02-18: qty 30, 30d supply, fill #0

## 2022-02-21 ENCOUNTER — Other Ambulatory Visit (HOSPITAL_BASED_OUTPATIENT_CLINIC_OR_DEPARTMENT_OTHER): Payer: Self-pay

## 2022-02-22 ENCOUNTER — Other Ambulatory Visit (HOSPITAL_BASED_OUTPATIENT_CLINIC_OR_DEPARTMENT_OTHER): Payer: Self-pay

## 2022-03-04 ENCOUNTER — Other Ambulatory Visit (HOSPITAL_BASED_OUTPATIENT_CLINIC_OR_DEPARTMENT_OTHER): Payer: Self-pay

## 2022-04-09 DIAGNOSIS — F902 Attention-deficit hyperactivity disorder, combined type: Secondary | ICD-10-CM | POA: Diagnosis not present

## 2022-04-09 DIAGNOSIS — F4323 Adjustment disorder with mixed anxiety and depressed mood: Secondary | ICD-10-CM | POA: Diagnosis not present

## 2022-05-09 DIAGNOSIS — F4323 Adjustment disorder with mixed anxiety and depressed mood: Secondary | ICD-10-CM | POA: Diagnosis not present

## 2022-05-09 DIAGNOSIS — F902 Attention-deficit hyperactivity disorder, combined type: Secondary | ICD-10-CM | POA: Diagnosis not present

## 2022-06-22 DIAGNOSIS — F331 Major depressive disorder, recurrent, moderate: Secondary | ICD-10-CM | POA: Diagnosis not present

## 2022-07-13 DIAGNOSIS — F902 Attention-deficit hyperactivity disorder, combined type: Secondary | ICD-10-CM | POA: Diagnosis not present

## 2022-07-13 DIAGNOSIS — F4323 Adjustment disorder with mixed anxiety and depressed mood: Secondary | ICD-10-CM | POA: Diagnosis not present

## 2022-08-15 DIAGNOSIS — F331 Major depressive disorder, recurrent, moderate: Secondary | ICD-10-CM | POA: Diagnosis not present

## 2022-10-04 ENCOUNTER — Other Ambulatory Visit: Payer: Self-pay | Admitting: Internal Medicine

## 2022-10-04 DIAGNOSIS — D259 Leiomyoma of uterus, unspecified: Secondary | ICD-10-CM

## 2022-10-04 DIAGNOSIS — G43009 Migraine without aura, not intractable, without status migrainosus: Secondary | ICD-10-CM | POA: Diagnosis not present

## 2022-10-04 DIAGNOSIS — F411 Generalized anxiety disorder: Secondary | ICD-10-CM | POA: Diagnosis not present

## 2022-10-04 DIAGNOSIS — K275 Chronic or unspecified peptic ulcer, site unspecified, with perforation: Secondary | ICD-10-CM | POA: Diagnosis not present

## 2022-10-04 DIAGNOSIS — Z01419 Encounter for gynecological examination (general) (routine) without abnormal findings: Secondary | ICD-10-CM | POA: Diagnosis not present

## 2022-10-04 DIAGNOSIS — Z113 Encounter for screening for infections with a predominantly sexual mode of transmission: Secondary | ICD-10-CM | POA: Diagnosis not present

## 2022-10-04 DIAGNOSIS — F5101 Primary insomnia: Secondary | ICD-10-CM | POA: Diagnosis not present

## 2022-10-04 DIAGNOSIS — E039 Hypothyroidism, unspecified: Secondary | ICD-10-CM | POA: Diagnosis not present

## 2022-10-04 DIAGNOSIS — M19042 Primary osteoarthritis, left hand: Secondary | ICD-10-CM | POA: Diagnosis not present

## 2022-10-04 DIAGNOSIS — Z0000 Encounter for general adult medical examination without abnormal findings: Secondary | ICD-10-CM | POA: Diagnosis not present

## 2022-10-04 DIAGNOSIS — F332 Major depressive disorder, recurrent severe without psychotic features: Secondary | ICD-10-CM | POA: Diagnosis not present

## 2022-10-04 DIAGNOSIS — F988 Other specified behavioral and emotional disorders with onset usually occurring in childhood and adolescence: Secondary | ICD-10-CM | POA: Diagnosis not present

## 2022-10-04 DIAGNOSIS — L700 Acne vulgaris: Secondary | ICD-10-CM | POA: Diagnosis not present

## 2022-10-04 DIAGNOSIS — N3010 Interstitial cystitis (chronic) without hematuria: Secondary | ICD-10-CM | POA: Diagnosis not present

## 2022-10-05 DIAGNOSIS — E041 Nontoxic single thyroid nodule: Secondary | ICD-10-CM | POA: Diagnosis not present

## 2022-10-05 DIAGNOSIS — R221 Localized swelling, mass and lump, neck: Secondary | ICD-10-CM | POA: Diagnosis not present

## 2022-10-06 ENCOUNTER — Ambulatory Visit
Admission: RE | Admit: 2022-10-06 | Discharge: 2022-10-06 | Disposition: A | Discharge status: Home / Self Care | Payer: Medicaid Other | Source: Ambulatory Visit | Attending: Internal Medicine | Admitting: Internal Medicine

## 2022-10-06 ENCOUNTER — Other Ambulatory Visit: Payer: Self-pay | Admitting: Internal Medicine

## 2022-10-06 DIAGNOSIS — R221 Localized swelling, mass and lump, neck: Secondary | ICD-10-CM

## 2022-10-06 MED ORDER — IOPAMIDOL (ISOVUE-300) INJECTION 61%
75.0000 mL | Freq: Once | INTRAVENOUS | Status: AC | PRN
  Administered 2022-10-06: 75 mL via INTRAVENOUS

## 2022-10-11 DIAGNOSIS — F902 Attention-deficit hyperactivity disorder, combined type: Secondary | ICD-10-CM | POA: Diagnosis not present

## 2022-10-11 DIAGNOSIS — F4323 Adjustment disorder with mixed anxiety and depressed mood: Secondary | ICD-10-CM | POA: Diagnosis not present

## 2022-10-17 DIAGNOSIS — D225 Melanocytic nevi of trunk: Secondary | ICD-10-CM | POA: Diagnosis not present

## 2022-10-17 DIAGNOSIS — Z7189 Other specified counseling: Secondary | ICD-10-CM | POA: Diagnosis not present

## 2022-10-17 DIAGNOSIS — L814 Other melanin hyperpigmentation: Secondary | ICD-10-CM | POA: Diagnosis not present

## 2022-10-17 DIAGNOSIS — L700 Acne vulgaris: Secondary | ICD-10-CM | POA: Diagnosis not present

## 2022-10-17 DIAGNOSIS — L818 Other specified disorders of pigmentation: Secondary | ICD-10-CM | POA: Diagnosis not present

## 2022-10-17 DIAGNOSIS — L821 Other seborrheic keratosis: Secondary | ICD-10-CM | POA: Diagnosis not present

## 2022-10-28 ENCOUNTER — Ambulatory Visit
Admission: RE | Admit: 2022-10-28 | Discharge: 2022-10-28 | Disposition: A | Discharge status: Home / Self Care | Payer: Commercial Managed Care - PPO | Source: Ambulatory Visit | Attending: Internal Medicine | Admitting: Internal Medicine

## 2022-10-28 DIAGNOSIS — Z1212 Encounter for screening for malignant neoplasm of rectum: Secondary | ICD-10-CM | POA: Diagnosis not present

## 2022-10-28 DIAGNOSIS — Z1211 Encounter for screening for malignant neoplasm of colon: Secondary | ICD-10-CM | POA: Diagnosis not present

## 2022-10-28 DIAGNOSIS — N83292 Other ovarian cyst, left side: Secondary | ICD-10-CM | POA: Diagnosis not present

## 2022-10-28 DIAGNOSIS — D259 Leiomyoma of uterus, unspecified: Secondary | ICD-10-CM

## 2022-11-02 DIAGNOSIS — M79642 Pain in left hand: Secondary | ICD-10-CM | POA: Diagnosis not present

## 2022-11-04 LAB — COLOGUARD: COLOGUARD: NEGATIVE

## 2022-11-04 LAB — EXTERNAL GENERIC LAB PROCEDURE: COLOGUARD: NEGATIVE

## 2022-11-24 DIAGNOSIS — D250 Submucous leiomyoma of uterus: Secondary | ICD-10-CM | POA: Diagnosis not present

## 2022-11-24 DIAGNOSIS — N9489 Other specified conditions associated with female genital organs and menstrual cycle: Secondary | ICD-10-CM | POA: Diagnosis not present

## 2022-11-24 DIAGNOSIS — N83202 Unspecified ovarian cyst, left side: Secondary | ICD-10-CM | POA: Diagnosis not present

## 2022-11-24 DIAGNOSIS — N760 Acute vaginitis: Secondary | ICD-10-CM | POA: Diagnosis not present

## 2022-11-29 DIAGNOSIS — F331 Major depressive disorder, recurrent, moderate: Secondary | ICD-10-CM | POA: Diagnosis not present

## 2022-12-06 DIAGNOSIS — N912 Amenorrhea, unspecified: Secondary | ICD-10-CM | POA: Diagnosis not present

## 2022-12-22 ENCOUNTER — Encounter: Payer: Self-pay | Admitting: Physician Assistant

## 2022-12-22 ENCOUNTER — Ambulatory Visit: Payer: Medicaid Other | Admitting: Physician Assistant

## 2022-12-22 VITALS — BP 100/80 | HR 92 | Ht 64.25 in | Wt 157.5 lb

## 2022-12-22 DIAGNOSIS — K219 Gastro-esophageal reflux disease without esophagitis: Secondary | ICD-10-CM

## 2022-12-22 DIAGNOSIS — K581 Irritable bowel syndrome with constipation: Secondary | ICD-10-CM | POA: Diagnosis not present

## 2022-12-22 DIAGNOSIS — Z1211 Encounter for screening for malignant neoplasm of colon: Secondary | ICD-10-CM | POA: Diagnosis not present

## 2022-12-22 DIAGNOSIS — R198 Other specified symptoms and signs involving the digestive system and abdomen: Secondary | ICD-10-CM

## 2022-12-22 NOTE — Progress Notes (Signed)
12/22/2022 Vanessa Fischer [id number] 1965-11-01  Referring provider: Collene Mares, PA Primary GI doctor: Dr. Leonides Schanz  ASSESSMENT AND PLAN:   Perforated abdominal viscus 09/04/2021 exploratory laparotomy with omental pedicle flap/Graham patch closure of perforated prepyloric ulcer with Dr. Doylene Canard 09/07/2021 UGI POD #3 negative for leak Has had some continued AB pain/GERD/regurg if she is off pantoprazole, some symptoms even on the medication.  Will schedule for EGD with Dr. Leonides Schanz to rule out gastritis, H pylori.  I discussed risks of EGD with patient today, including risk of sedation, bleeding or perforation.  Patient provides understanding and gave verbal consent to proceed.  Gastroesophageal reflux disease, unspecified whether esophagitis present she reports symptoms are currently well controlled, and denies breakthrough reflux or night time symptoms. Lifestyle changes discussed, avoid NSAIDS Stop ETOH Continue protonix 40 mg BID  Screen for colon cancer Cologuard negative 10/2022 recall 3 years  IBS-C Per patient, continue magnesium citrate.  Get Xray Can consider IBGARD/FODMAP after endoscopic evaluation.   Patient Care Team: Collene Mares, [place] as PCP - General (Internal Medicine)  HISTORY OF PRESENT ILLNESS: 57 y.o. female with a past medical history of migraines, ADHD, and others listed below, presents for follow-up.  09/04/21 patient in the hospital for perforated prepyloric ulcer, thought to be NSAID induced and alcohol.  04/1 laparotomy with omental pedicle flap/Graham patch closure Dr. Doylene Canard.  09/07/2021 upper GI negative for leak.  H. pylori negative Was was to be set up for endoscopy however this was never done. 10/29/2022 cologuard negative after second attempt was insufficient.   Patient states last year she lost her job, she was fired from [employer] and has been unable to get a new job so far but has some further job aspects.  States he has IBS.  She  states she has had AB pain for 4-5 days recently with bloating, decreased appetite, has not been getting her hormone treatments and blaming that.  She states if she drinks a few days in a row acidic drinks or ETOH, will have regurgitation. Worse at night.  Denies dysphagia.  She is on ibuprofen rare, for 2-3 days, but tried to avoid it.  She will drink on weekends only, 4-5 hard selzer, drinks carbonated water.  She has been on protonix 40 mg twice a day, she ran out of it for a few days and states it was uncontrolled pain/GERD.   09/04/21 CT renal stone  Current Medications:   Current Outpatient Medications (Endocrine & Metabolic):    ESTROGENS CONJ SYNTHETIC A PO, Take by mouth. pellets   NP THYROID 90 MG tablet, Take 90 mg by mouth daily.   progesterone (PROMETRIUM) 100 MG capsule, Take 100 mg by mouth at bedtime.   TESTOSTERONE IM, Inject into the muscle. Every 3 months  Current Outpatient Medications (Cardiovascular):    spironolactone (ALDACTONE) 100 MG tablet, Take 100 mg by mouth daily.   Current Outpatient Medications (Analgesics):    AJOVY 225 MG/1.5ML SOSY, Inject into the skin.   ibuprofen (ADVIL) 800 MG tablet, Take 800 mg by mouth 3 (three) times daily.   NURTEC 75 MG TBDP, Take 1 tablet by mouth as needed.   Current Outpatient Medications (Other):    5-Hydroxytryptophan (5-HTP PO), Take 1 tablet by mouth daily.   LORazepam (ATIVAN) 1 MG tablet, Take 0.5 mg by mouth daily as needed for anxiety.   MAGNESIUM CITRATE PO*, Take by mouth.   ondansetron (ZOFRAN-ODT) 8 MG disintegrating tablet, Take 8 mg by mouth every  8 (eight) hours as needed.   pantoprazole (PROTONIX) 40 MG tablet, Take 1 tablet (40 mg total) by mouth 2 (two) times daily.   RETIN-A 0.05 % cream, Apply 1 Application topically at bedtime.   tiZANidine (ZANAFLEX) 4 MG tablet, Take 4 mg by mouth as needed.   traZODone (DESYREL) 50 MG tablet, Take 50 mg by mouth at bedtime.   VYVANSE 60 MG CHEW, Chew 1 tablet  by mouth daily. * These medications belong to multiple therapeutic classes and are listed under each applicable group.  Medical History:  Past Medical History:  Diagnosis Date   Asthma    Cystitis, interstitial    GERD (gastroesophageal reflux disease)    Intestinal ulcer    Irritable bowel syndrome    Thyroid disease    Allergies: No Known Allergies   Surgical History:  She  has a past surgical history that includes Myomectomy; Cesarean section; Scar revision; Breast enhancement surgery; Tubal ligation; Breast surgery; Cosmetic surgery; Bunionectomy; bone spurs; Augmentation mammaplasty (Bilateral, 2004); Augmentation mammaplasty (Bilateral, 2010); Augmentation mammaplasty (Bilateral, 2014); and laparotomy (N/A, 09/04/2021). Family History:  Her family history includes Diabetes in her mother; Diverticulitis in her father; Drug abuse in her brother and brother; Glaucoma in her paternal grandmother; Heart attack in her mother; Hepatitis C in her brother; Hypertension in her brother and father; Kidney disease in her brother; Liver disease in her brother; Rheumatic fever in her maternal grandmother. Social History:   reports that she has never smoked. She has never used smokeless tobacco. She reports current alcohol use of about 3.0 standard drinks of alcohol per week. She reports that she does not use drugs.  REVIEW OF SYSTEMS  : All other systems reviewed and negative except where noted in the History of Present Illness.  PHYSICAL EXAM: BP 100/80 (BP Location: Left Arm, Patient Position: Sitting, Cuff Size: Normal)   Pulse 92   Ht 5' 4.25" (1.632 m)   Wt 157 lb 8 oz (71.4 kg)   BMI 26.82 kg/m  General:   Pleasant, well developed female in no acute distress Head:   Normocephalic and atraumatic. Eyes:  sclerae anicteric,conjunctive pink  Heart:   regular rate and rhythm Pulm:  Clear anteriorly; no wheezing Abdomen:   Soft, Flat AB, well healed vertical scar above the umblicus, Active  bowel sounds. mild tenderness  left of umblicus . Without guarding and Without rebound, No organomegaly appreciated. Rectal: Not evaluated Extremities:  Without edema. Msk: Symmetrical without gross deformities. Peripheral pulses intact.  Neurologic:  Alert and  oriented x4;  No focal deficits.  Skin:   Dry and intact without significant lesions or rashes. Psychiatric:  Cooperative. Normal mood and affect.    Doree Albee, PA-C 3:34 PM

## 2022-12-22 NOTE — Patient Instructions (Addendum)
Please take your proton pump inhibitor medication, protonix 40 mg twice a day  Please take this medication 30 minutes to 1 hour before meals- this makes it more effective.  Avoid spicy and acidic foods Avoid fatty foods Limit your intake of coffee, tea, alcohol, and carbonated drinks Work to maintain a healthy weight Keep the head of the bed elevated at least 3 inches with blocks or a wedge pillow if you are having any nighttime symptoms Stay upright for 2 hours after eating Avoid meals and snacks three to four hours before bedtime You have been scheduled for an endoscopy. Please follow written instructions given to you at your visit today.  If you use inhalers (even only as needed), please bring them with you on the day of your procedure.  If you take any of the following medications, they will need to be adjusted prior to your procedure:   DO NOT TAKE 7 DAYS PRIOR TO TEST- Trulicity (dulaglutide) Ozempic, Wegovy (semaglutide) Mounjaro (tirzepatide) Bydureon Bcise (exanatide extended release)  DO NOT TAKE 1 DAY PRIOR TO YOUR TEST Rybelsus (semaglutide) Adlyxin (lixisenatide) Victoza (liraglutide) Byetta (exanatide) ___________________________________________________________________________     _______________________________________________________  If your blood pressure at your visit was 140/90 or greater, please contact your primary care physician to follow up on this.  _______________________________________________________  If you are age 39 or older, your body mass index should be between 23-30. Your Body mass index is 26.82 kg/m. If this is out of the aforementioned range listed, please consider follow up with your Primary Care Provider.  If you are age 33 or younger, your body mass index should be between 19-25. Your Body mass index is 26.82 kg/m. If this is out of the aformentioned range listed, please consider follow up with your Primary Care Provider.    ________________________________________________________  The [place] [facility] providers would like to encourage you to use [facility] to communicate with providers for non-urgent requests or questions.  Due to long hold times on the telephone, sending your provider a message by [facility] may be a faster and more efficient way to get a response.  Please allow 48 business hours for a response.  Please remember that this is for non-urgent requests.  _______________________________________________________ It was a pleasure to see you today!  Thank you for trusting me with your gastrointestinal care!

## 2022-12-22 NOTE — Progress Notes (Signed)
I agree with the assessment and plan as outlined by Ms. Collier. 

## 2023-01-02 DIAGNOSIS — K275 Chronic or unspecified peptic ulcer, site unspecified, with perforation: Secondary | ICD-10-CM | POA: Diagnosis not present

## 2023-01-02 DIAGNOSIS — F5101 Primary insomnia: Secondary | ICD-10-CM | POA: Diagnosis not present

## 2023-01-02 DIAGNOSIS — M5432 Sciatica, left side: Secondary | ICD-10-CM | POA: Diagnosis not present

## 2023-01-02 DIAGNOSIS — M419 Scoliosis, unspecified: Secondary | ICD-10-CM | POA: Diagnosis not present

## 2023-01-02 DIAGNOSIS — G43009 Migraine without aura, not intractable, without status migrainosus: Secondary | ICD-10-CM | POA: Diagnosis not present

## 2023-01-03 DIAGNOSIS — F331 Major depressive disorder, recurrent, moderate: Secondary | ICD-10-CM | POA: Diagnosis not present

## 2023-01-10 ENCOUNTER — Encounter: Payer: Self-pay | Admitting: Certified Registered Nurse Anesthetist

## 2023-01-10 DIAGNOSIS — F4323 Adjustment disorder with mixed anxiety and depressed mood: Secondary | ICD-10-CM | POA: Diagnosis not present

## 2023-01-10 DIAGNOSIS — F902 Attention-deficit hyperactivity disorder, combined type: Secondary | ICD-10-CM | POA: Diagnosis not present

## 2023-01-12 ENCOUNTER — Ambulatory Visit: Payer: Medicaid Other | Admitting: Internal Medicine

## 2023-01-12 ENCOUNTER — Encounter: Payer: Self-pay | Admitting: Internal Medicine

## 2023-01-12 VITALS — BP 128/83 | HR 95 | Temp 97.5°F | Resp 11 | Ht 64.25 in | Wt 157.0 lb

## 2023-01-12 DIAGNOSIS — K2970 Gastritis, unspecified, without bleeding: Secondary | ICD-10-CM

## 2023-01-12 DIAGNOSIS — R198 Other specified symptoms and signs involving the digestive system and abdomen: Secondary | ICD-10-CM | POA: Diagnosis not present

## 2023-01-12 DIAGNOSIS — J45909 Unspecified asthma, uncomplicated: Secondary | ICD-10-CM | POA: Diagnosis not present

## 2023-01-12 DIAGNOSIS — K219 Gastro-esophageal reflux disease without esophagitis: Secondary | ICD-10-CM

## 2023-01-12 DIAGNOSIS — K2950 Unspecified chronic gastritis without bleeding: Secondary | ICD-10-CM | POA: Diagnosis not present

## 2023-01-12 MED ORDER — SODIUM CHLORIDE 0.9 % IV SOLN: 500.0000 mL | INTRAVENOUS | Status: AC

## 2023-01-12 NOTE — Op Note (Addendum)
[facility] Patient Name: Vanessa Fischer Procedure Date: 01/12/2023 3:16 PM MRN: [id number] Endoscopist: Particia Lather , , [id number] Age: 57 Referring MD:  Date of Birth: 1966/04/09 Gender: Female Account #: [id number] Procedure:                Upper GI endoscopy Indications:              Abdominal pain, Heartburn, Follow-up of peptic                            ulcer with perforation Medicines:                Monitored Anesthesia Care Procedure:                Pre-Anesthesia Assessment:                           - Prior to the procedure, a History and Physical                            was performed, and patient medications and                            allergies were reviewed. The patient's tolerance of                            previous anesthesia was also reviewed. The risks                            and benefits of the procedure and the sedation                            options and risks were discussed with the patient.                            All questions were answered, and informed consent                            was obtained. Prior Anticoagulants: The patient has                            taken no anticoagulant or antiplatelet agents. ASA                            Grade Assessment: II - A patient with mild systemic                            disease. After reviewing the risks and benefits,                            the patient was deemed in satisfactory condition to                            undergo the procedure.  After obtaining informed consent, the endoscope was                            passed under direct vision. Throughout the                            procedure, the patient's blood pressure, pulse, and                            oxygen saturations were monitored continuously. The                            Olympus Scope [id number] was introduced through the                            mouth, and advanced to the  second part of duodenum.                            The upper GI endoscopy was accomplished without                            difficulty. The patient tolerated the procedure                            well. Scope In: Scope Out: Findings:                 The examined esophagus was normal.                           Retained fluid was found in the gastric body.                           Localized inflammation characterized by congestion                            (edema), erosions and erythema was found in the                            gastric antrum. Biopsies were taken with a cold                            forceps for histology.                           The examined duodenum was normal. Complications:            No immediate complications. Estimated Blood Loss:     Estimated blood loss was minimal. Impression:               - Normal esophagus.                           - Retained gastric fluid.                           - Gastritis. Biopsied.                           -  Normal examined duodenum. Recommendation:           - Discharge patient to home (with escort).                           - Await pathology results.                           - Continue pantoprazole 40 mg twice daily.                           - Avoid NSAID use if possible.                           - Return to GI clinic in 6 months for follow up.                           - The findings and recommendations were discussed                            with the patient. Dr Particia Lather "Alan Ripper" Leonides Schanz,  01/12/2023 3:37:01 PM

## 2023-01-12 NOTE — Progress Notes (Signed)
GASTROENTEROLOGY PROCEDURE H&P NOTE   Primary Care Physician: Hyacinth Meeker, [place], [place]    Reason for Procedure:   GERD, abdominal pain, history of perforated prepyloric ulcer s/p surgical closure  Plan:    EGD  Patient is appropriate for endoscopic procedure(s) in the ambulatory ([facility]) setting.  The nature of the procedure, as well as the risks, benefits, and alternatives were carefully and thoroughly reviewed with the patient. Ample time for discussion and questions allowed. The patient understood, was satisfied, and agreed to proceed.     HPI: Vanessa Fischer is a 57 y.o. female who presents for EGD for evaluation of GERD, abdominal pain, and history of perforated prepyloric ulcer s/p surgical closure .  Patient was most recently seen in the Gastroenterology Clinic on 12/22/22.  No interval change in medical history since that appointment. Please refer to that note for full details regarding GI history and clinical presentation.   Past Medical History:  Diagnosis Date   Asthma    Cystitis, interstitial    GERD (gastroesophageal reflux disease)    Intestinal ulcer    Irritable bowel syndrome    Thyroid disease     Past Surgical History:  Procedure Laterality Date   AUGMENTATION MAMMAPLASTY Bilateral 2004   AUGMENTATION MAMMAPLASTY Bilateral 2010   AUGMENTATION MAMMAPLASTY Bilateral 2014   bone spurs     BREAST ENHANCEMENT SURGERY     BREAST SURGERY     BUNIONECTOMY     CESAREAN SECTION     COSMETIC SURGERY     LAPAROTOMY N/A 09/04/2021   Procedure: EXPLORATORY LAPAROTOMY with Peggye Ley;  Surgeon: Berna Bue, MD;  Location: [facility];  Service: General;  Laterality: N/A;   MYOMECTOMY     SCAR REVISION     TUBAL LIGATION      Prior to Admission medications   Medication Sig Start Date End Date Taking? Authorizing Provider  cetirizine-pseudoephedrine (ZYRTEC-D ALLERGY & CONGESTION) 5-120 MG tablet Take 1 tablet by mouth daily as needed.   Yes [provider]   NP THYROID 90 MG tablet Take 90 mg by mouth daily. 10/28/22  Yes [provider]  NURTEC 75 MG TBDP Take 1 tablet by mouth as needed. 10/10/22  Yes [provider]  ondansetron (ZOFRAN-ODT) 8 MG disintegrating tablet Take 8 mg by mouth every 8 (eight) hours as needed.   Yes [provider]  pantoprazole (PROTONIX) 40 MG tablet Take 1 tablet (40 mg total) by mouth 2 (two) times daily. 09/09/21  Yes Meuth, Lina Sar, PA-C  spironolactone (ALDACTONE) 100 MG tablet Take 100 mg by mouth daily. 02/04/20  Yes [provider]  tiZANidine (ZANAFLEX) 4 MG tablet Take 4 mg by mouth as needed. 10/16/21  Yes [provider]  traZODone (DESYREL) 50 MG tablet Take 50 mg by mouth at bedtime.   Yes [provider]  VYVANSE 60 MG CHEW Chew 1 tablet by mouth daily. 12/07/22  Yes [provider]  5-Hydroxytryptophan (5-HTP PO) Take 1 tablet by mouth daily.    [provider]  AJOVY 225 MG/1.5ML SOSY Inject into the skin. 06/01/21   [provider]  ESTROGENS CONJ SYNTHETIC A PO Take by mouth. pellets    [provider]  ibuprofen (ADVIL) 800 MG tablet Take 800 mg by mouth 3 (three) times daily. 10/04/22   [provider]  LORazepam (ATIVAN) 1 MG tablet Take 0.5 mg by mouth daily as needed for anxiety.    [provider]  MAGNESIUM CITRATE  PO Take by mouth.    [provider]  progesterone (PROMETRIUM) 100 MG capsule Take 100 mg by mouth at bedtime.    [provider]  RETIN-A 0.05 % cream Apply 1 Application topically at bedtime. 10/18/22   [provider]  TESTOSTERONE IM Inject into the muscle. Every 3 months    [provider]    Current Outpatient Medications  Medication Sig Dispense Refill   cetirizine-pseudoephedrine (ZYRTEC-D ALLERGY & CONGESTION) 5-120 MG tablet Take 1 tablet by mouth daily as needed.     NP THYROID 90 MG tablet Take 90 mg by mouth daily.     NURTEC 75 MG  TBDP Take 1 tablet by mouth as needed.     ondansetron (ZOFRAN-ODT) 8 MG disintegrating tablet Take 8 mg by mouth every 8 (eight) hours as needed.     pantoprazole (PROTONIX) 40 MG tablet Take 1 tablet (40 mg total) by mouth 2 (two) times daily. 60 tablet 1   spironolactone (ALDACTONE) 100 MG tablet Take 100 mg by mouth daily.     tiZANidine (ZANAFLEX) 4 MG tablet Take 4 mg by mouth as needed.     traZODone (DESYREL) 50 MG tablet Take 50 mg by mouth at bedtime.     VYVANSE 60 MG CHEW Chew 1 tablet by mouth daily.     5-Hydroxytryptophan (5-HTP PO) Take 1 tablet by mouth daily.     AJOVY 225 MG/1.5ML SOSY Inject into the skin.     ESTROGENS CONJ SYNTHETIC A PO Take by mouth. pellets     ibuprofen (ADVIL) 800 MG tablet Take 800 mg by mouth 3 (three) times daily.     LORazepam (ATIVAN) 1 MG tablet Take 0.5 mg by mouth daily as needed for anxiety.     MAGNESIUM CITRATE PO Take by mouth.     progesterone (PROMETRIUM) 100 MG capsule Take 100 mg by mouth at bedtime.     RETIN-A 0.05 % cream Apply 1 Application topically at bedtime.     TESTOSTERONE IM Inject into the muscle. Every 3 months     Current Facility-Administered Medications  Medication Dose Route Frequency Provider Last Rate Last Admin   0.9 %  sodium chloride infusion  500 mL Intravenous Continuous Imogene Burn, MD        Allergies as of 01/12/2023   (No Known Allergies)    Family History  Problem Relation Age of Onset   Diabetes Mother    Heart attack Mother    Hypertension Father    Diverticulitis Father    Hypertension Brother    Drug abuse Brother        Drug Overdose   Drug abuse Brother        Over dose   Kidney disease Brother    Liver disease Brother    Hepatitis C Brother    Rheumatic fever Maternal Grandmother    Glaucoma Paternal Grandmother    Breast cancer Neg Hx    Colon cancer Neg Hx    Colon polyps Neg Hx    Esophageal cancer Neg Hx    Rectal cancer Neg Hx    Stomach cancer Neg Hx     Social  History   Socioeconomic History   Marital status: Single    Spouse name: Not on file   Number of children: Not on file   Years of education: Not on file   Highest education level: Not on file  Occupational History   Not on file  Tobacco Use  Smoking status: Never   Smokeless tobacco: Never  Vaping Use   Vaping status: Never Used  Substance and Sexual Activity   Alcohol use: Yes    Alcohol/week: 3.0 standard drinks of alcohol    Types: 3 Glasses of wine per week   Drug use: Never   Sexual activity: Yes    Birth control/protection: Surgical  Other Topics Concern   Not on file  Social History Narrative   Not on file   Social Determinants of Health   Financial Resource Strain: Not on file  Food Insecurity: Not on file  Transportation Needs: Not on file  Physical Activity: Not on file  Stress: Not on file  Social Connections: Not on file  Intimate Partner Violence: Not on file    Physical Exam: Vital signs in last 24 hours: BP 116/75   Pulse 100   Temp (!) 97.5 F (36.4 C) (Temporal)   Ht 5' 4.25" (1.632 m)   Wt 157 lb (71.2 kg)   SpO2 97%   BMI 26.74 kg/m  GEN: NAD EYE: Sclerae anicteric ENT: MMM CV: Non-tachycardic Pulm: No increased WOB GI: Soft NEURO:  Alert & Oriented   Eulah Pont, MD [facility]   01/12/2023 3:14 PM

## 2023-01-12 NOTE — Progress Notes (Signed)
Called to room to assist during endoscopic procedure.  Patient ID and intended procedure confirmed with present staff. Received instructions for my participation in the procedure from the performing physician.  

## 2023-01-12 NOTE — Patient Instructions (Signed)
YOU HAD AN ENDOSCOPIC PROCEDURE TODAY AT THE [facility]:   Refer to the procedure report that was given to you for any specific questions about what was found during the examination.  If the procedure report does not answer your questions, please call your gastroenterologist to clarify.  If you requested that your care partner not be given the details of your procedure findings, then the procedure report has been included in a sealed envelope for you to review at your convenience later.  YOU SHOULD EXPECT: Some feelings of bloating in the abdomen. Passage of more gas than usual.  Walking can help get rid of the air that was put into your GI tract during the procedure and reduce the bloating. If you had a lower endoscopy (such as a colonoscopy or flexible sigmoidoscopy) you may notice spotting of blood in your stool or on the toilet paper. If you underwent a bowel prep for your procedure, you may not have a normal bowel movement for a few days.  Please Note:  You might notice some irritation and congestion in your nose or some drainage.  This is from the oxygen used during your procedure.  There is no need for concern and it should clear up in a day or so.  SYMPTOMS TO REPORT IMMEDIATELY:   Following upper endoscopy (EGD)  Vomiting of blood or coffee ground material  New chest pain or pain under the shoulder blades  Painful or persistently difficult swallowing  New shortness of breath  Fever of 100F or higher  Black, tarry-looking stools  For urgent or emergent issues, a gastroenterologist can be reached at any hour by calling [phone]. Do not use MyChart messaging for urgent concerns.    DIET:  We do recommend a small meal at first, but then you may proceed to your regular diet.  Drink plenty of fluids but you should avoid alcoholic beverages for 24 hours.  MEDICATIONS: Continue present medications. Continue pantoprazole 40 mg by mouth twice daily.  AVOID NSAID  (non-steroidal anti-inflammatory drugs) use if possible.  FOLLOW UP: Await pathology results. Follow up with Dr. Leonides Schanz in her office for an appointment in 6 months (appointment recall put in computer, schedule not that far out at this time).  Please see handouts given to you by your recovery nurse: Gastritis.  Thank you for allowing [place] to provide for your healthcare needs today.  ACTIVITY:  You should plan to take it easy for the rest of today and you should NOT DRIVE or use heavy machinery until tomorrow (because of the sedation medicines used during the test).    FOLLOW UP: Our staff will call the number listed on your records the next business day following your procedure.  We will call around 7:15- 8:00 am to check on you and address any questions or concerns that you may have regarding the information given to you following your procedure. If we do not reach you, we will leave a message.     If any biopsies were taken you will be contacted by phone or by letter within the next 1-3 weeks.  Please call us at [phone] if you have not heard about the biopsies in 3 weeks.    SIGNATURES/CONFIDENTIALITY: You and/or your care partner have signed paperwork which will be entered into your electronic medical record.  These signatures attest to the fact that that the information above on your After Visit Summary has been reviewed and is understood.  Full responsibility of the  confidentiality of this discharge information lies with you and/or your care-partner.

## 2023-01-12 NOTE — Progress Notes (Signed)
Report given to PACU, vss 

## 2023-01-13 ENCOUNTER — Telehealth: Payer: Self-pay | Admitting: *Deleted

## 2023-01-13 NOTE — Telephone Encounter (Signed)
Post procedure follow up call placed, no answer and left VM.  

## 2023-01-24 DIAGNOSIS — N949 Unspecified condition associated with female genital organs and menstrual cycle: Secondary | ICD-10-CM | POA: Diagnosis not present

## 2023-01-25 ENCOUNTER — Encounter: Payer: Self-pay | Admitting: Internal Medicine

## 2023-01-31 ENCOUNTER — Ambulatory Visit: Payer: Medicaid Other | Admitting: Orthopedic Surgery

## 2023-01-31 ENCOUNTER — Other Ambulatory Visit (INDEPENDENT_AMBULATORY_CARE_PROVIDER_SITE_OTHER): Payer: Medicaid Other

## 2023-01-31 DIAGNOSIS — M545 Low back pain, unspecified: Secondary | ICD-10-CM

## 2023-01-31 NOTE — Progress Notes (Signed)
Orthopedic Spine Surgery Office Note  Assessment: Patient is a 57 y.o. female with low back pain and radicular left leg pain that has been slowly resolving   Plan: -Explained that initially conservative treatment is tried as a significant number of patients may experience relief with these treatment modalities. Discussed that the conservative treatments include:  -activity modification  -physical therapy  -over the counter pain medications  -medrol dosepak  -lumbar steroid injections -Patient has tried heat, Tylenol -Recommended core strengthening.  No further intervention at this time since pain is getting better -Patient should return to office on an as-needed basis   Patient expressed understanding of the plan and all questions were answered to the patient's satisfaction.   ___________________________________________________________________________   History:  Patient is a 57 y.o. female who presents today for lumbar spine.  Patient has had low back pain for about 6 years that comes and goes.  She said it started after she was unpacking.  Her more recent problem has been pain radiating down the left leg that has started within the last 3 weeks.  She feels it started in the anterior thigh and then migrated to the lateral aspect of the thigh to the level of the knee.  It is not radiate past the knee.  Pain has been getting better with time.  She was taking Tylenol to control the pain but now is just using heat since it has gotten better.  No pain radiating into the right lower extremity.  No trauma or injury that preceded the onset of the leg pain.   Weakness: Denies Symptoms of imbalance: Denies Paresthesias and numbness: Denies Bowel or bladder incontinence: Denies Saddle anesthesia: Denies  Treatments tried: Heat, Tylenol  Review of systems: Denies fevers and chills, night sweats, unexplained weight loss, history of cancer, pain that wakes them at night  Past medical  history: Asthma GERD Interstitial cystitis Irritable bowel syndrome  Allergies: NKDA  Past surgical history:  Breast augmentation Bunionectomy C section Exploratory laparotomy Myomectomy Scar revision Tubal ligation  Social history: Denies use of nicotine product (smoking, vaping, patches, smokeless) Alcohol use: Yes, approximately 3 drinks per week Denies recreational drug use   Physical Exam:  BMI of 26.9  General: no acute distress, appears stated age Neurologic: alert, answering questions appropriately, following commands Respiratory: unlabored breathing on room air, symmetric chest rise Psychiatric: appropriate affect, normal cadence to speech   MSK (spine):  -Strength exam      Left  Right EHL    5/5  5/5 TA    5/5  5/5 GSC    5/5  5/5 Knee extension  5/5  5/5 Hip flexion   5/5  5/5  -Sensory exam    Sensation intact to light touch in L3-S1 nerve distributions of bilateral lower extremities  -Achilles DTR: 1/4 on the left, 1/4 on the right -Patellar tendon DTR: 1/4 on the left, 1/4 on the right  -Straight leg raise: Negative bilaterally -Femoral nerve stretch test: Negative bilaterally -Clonus: no beats bilaterally  -Left hip exam: No pain through range of motion, negative Stinchfield, negative FABER -Right hip exam: No pain through range of motion, negative Stinchfield, negative FABER  Imaging: XRs of the lumbar spine from 01/31/2023 were independently reviewed and interpreted, showing disc height loss at L5/S1.  No other significant degenerative changes seen.  No fracture or dislocation seen.  No evidence of instability on flexion/extension views.   Patient name: Vanessa Fischer Patient MRN: [id number] Date of visit: 01/31/23

## 2023-06-29 ENCOUNTER — Encounter: Payer: Self-pay | Admitting: Internal Medicine

## 2023-06-29 DIAGNOSIS — Z23 Encounter for immunization: Secondary | ICD-10-CM | POA: Diagnosis not present

## 2023-06-29 DIAGNOSIS — R635 Abnormal weight gain: Secondary | ICD-10-CM | POA: Diagnosis not present

## 2023-06-29 DIAGNOSIS — L700 Acne vulgaris: Secondary | ICD-10-CM | POA: Diagnosis not present

## 2023-06-29 DIAGNOSIS — J4520 Mild intermittent asthma, uncomplicated: Secondary | ICD-10-CM | POA: Diagnosis not present

## 2023-06-29 DIAGNOSIS — G43009 Migraine without aura, not intractable, without status migrainosus: Secondary | ICD-10-CM | POA: Diagnosis not present

## 2023-06-29 DIAGNOSIS — E039 Hypothyroidism, unspecified: Secondary | ICD-10-CM | POA: Diagnosis not present

## 2023-06-29 DIAGNOSIS — F5101 Primary insomnia: Secondary | ICD-10-CM | POA: Diagnosis not present

## 2023-06-29 DIAGNOSIS — K219 Gastro-esophageal reflux disease without esophagitis: Secondary | ICD-10-CM | POA: Diagnosis not present

## 2023-06-29 DIAGNOSIS — N83209 Unspecified ovarian cyst, unspecified side: Secondary | ICD-10-CM | POA: Diagnosis not present

## 2023-06-29 DIAGNOSIS — Z8711 Personal history of peptic ulcer disease: Secondary | ICD-10-CM | POA: Diagnosis not present

## 2023-07-07 DIAGNOSIS — F4323 Adjustment disorder with mixed anxiety and depressed mood: Secondary | ICD-10-CM | POA: Diagnosis not present

## 2023-07-07 DIAGNOSIS — F902 Attention-deficit hyperactivity disorder, combined type: Secondary | ICD-10-CM | POA: Diagnosis not present

## 2023-07-21 DIAGNOSIS — N9489 Other specified conditions associated with female genital organs and menstrual cycle: Secondary | ICD-10-CM | POA: Diagnosis not present

## 2023-07-21 DIAGNOSIS — Z9889 Other specified postprocedural states: Secondary | ICD-10-CM | POA: Diagnosis not present

## 2023-09-20 ENCOUNTER — Ambulatory Visit: Admitting: Family Medicine

## 2023-09-20 ENCOUNTER — Encounter: Payer: Self-pay | Admitting: Family Medicine

## 2023-09-20 ENCOUNTER — Other Ambulatory Visit: Payer: Self-pay

## 2023-09-20 VITALS — BP 120/84 | HR 89 | Wt 158.0 lb

## 2023-09-20 DIAGNOSIS — N83202 Unspecified ovarian cyst, left side: Secondary | ICD-10-CM

## 2023-09-20 DIAGNOSIS — Z1331 Encounter for screening for depression: Secondary | ICD-10-CM | POA: Diagnosis not present

## 2023-09-20 NOTE — Progress Notes (Signed)
   GYNECOLOGY PROBLEM  VISIT ENCOUNTER NOTE  Subjective:   Vanessa Fischer is a 58 y.o. G28P1011 female here for a problem GYN visit.    Current complaints: ovarian cyst and referral to MIGs. Patient was referred to [facility] for MIGs surgery specifically by [facility] due to enlarging ovarian cyst which was found during a work up for groin pain.   Patient with extensive abdominal surgery history that complicated removal of the adnexal mass.  - 2006 myomectomy - 2007 PLTS - 2010 laproscopy with lysis of adhesion/endometrial ablatation/BTL - 2018 Abdominoplasty  - 09/2021 perforated gastric ulcer required an emergent ex-lap at [facility]   Known history of fibrroids which have been seen since prior to 2006  Adnexal mass was first noted on CT scan in 09/2021 that was performed looking for renal stone. Left sided mass was 4.9cm by this CT. She had follow up with an US  10/27/22 which showed  6.9 x4.6x5.8cm ovarian mass. At that time, appeared to be a simple cyst.   Patient reports US  in August 2024 that showed a larger mass but unable to see in her record or care everywhere. She reported it had grown to 9cm which was noted in Dr. Adrain Hopes note in Feb 2025 She reported negative tumor markers at that time. These were included in the referral     Reports dyspareunia since 2006. She attributes many symptoms to her hormonal depletion after her endometrial ablation. She is on HRT- bio identical testosterone , compounded progesterone  and estrogen that was started about mid 2023.    Seen by [facility], Dr. Adrain Hopes for GYN PCP [place]  [place] [facility] (for past 4 years)  I reviewed records from [facility] and her PCP.  Gynecologic History No LMP recorded. Patient has had an ablation.  Contraception: tubal ligation  Health Maintenance Due  Topic Date Due   HIV Screening  Never done   Hepatitis C Screening  Never done   DTaP/Tdap/Td (1 - Tdap) Never done   Pneumococcal Vaccine 66-13 Years old (1 of 2 -  PCV) Never done   Cervical Cancer Screening (HPV/Pap Cotest)  Never done   Colonoscopy  Never done   COVID-19 Vaccine (1 - 2024-25 season) Never done   MAMMOGRAM  08/20/2023   Zoster Vaccines- Shingrix (2 of 2) 08/24/2023    The following portions of the patient's history were reviewed and updated as appropriate: allergies, current medications, past family history, past medical history, past social history, past surgical history and problem list.  Review of Systems Pertinent items are noted in HPI.   Objective:  BP 120/84   Pulse 89   Wt 158 lb (71.7 kg)   BMI 26.91 kg/m  Gen: well appearing, NAD HEENT: no scleral icterus CV: RR Lung: Normal WOB Ext: warm well perfused   Assessment and Plan:  1. Left ovarian cyst (Primary) Coordinated with Clinical Manager to have patient placed on Dr Adjewole's Schedule Coordinated additional imaging given > 6 months since prior Sent chart to Dr. Hillis Lu apologies to patient - US  PELVIC COMPLETE WITH TRANSVAGINAL; Future - Ovarian Malignancy Risk-ROMA  Please refer to After Visit Summary for other counseling recommendations.   Return in about 2 weeks (around 10/04/2023) for Dr Donnise Galen, ovarian cyst.  Future Appointments  Date Time Provider Department Center  10/05/2023  8:55 AM Kiki Pelton, MD [facility]    Abner Ables, MD, MPH, ABFM Attending Physician Faculty Practice- Center for [facility]

## 2023-09-21 ENCOUNTER — Telehealth: Payer: Self-pay

## 2023-09-21 LAB — OVARIAN MALIGNANCY RISK-ROMA
Cancer Antigen (CA) 125: 12.1 U/mL (ref 0.0–38.1)
HE4: 52.8 pmol/L (ref 0.0–105.2)
Postmenopausal ROMA: 1.05
Premenopausal ROMA: 0.83

## 2023-09-21 LAB — PREMENOPAUSAL INTERP: LOW

## 2023-09-21 LAB — POSTMENOPAUSAL INTERP: LOW

## 2023-09-21 NOTE — Telephone Encounter (Signed)
 Called patient regarding her US  appointment. Patient US  is scheduled for 4/25 at 0900 and to arrive at 0830 with full bladder and [facility], entrance A. Patient confirmed scheduled appointment.  Patient will be notified of future office appointment.  Moira Andrews, RN

## 2023-09-23 ENCOUNTER — Encounter: Payer: Self-pay | Admitting: Family Medicine

## 2023-09-29 ENCOUNTER — Ambulatory Visit (HOSPITAL_COMMUNITY)
Admission: RE | Admit: 2023-09-29 | Discharge: 2023-09-29 | Disposition: A | Discharge status: Home / Self Care | Source: Ambulatory Visit | Attending: Family Medicine | Admitting: Family Medicine

## 2023-09-29 DIAGNOSIS — N83202 Unspecified ovarian cyst, left side: Secondary | ICD-10-CM | POA: Insufficient documentation

## 2023-10-03 ENCOUNTER — Encounter: Payer: Self-pay | Admitting: Family Medicine

## 2023-10-04 DIAGNOSIS — F902 Attention-deficit hyperactivity disorder, combined type: Secondary | ICD-10-CM | POA: Diagnosis not present

## 2023-10-04 DIAGNOSIS — F4323 Adjustment disorder with mixed anxiety and depressed mood: Secondary | ICD-10-CM | POA: Diagnosis not present

## 2023-10-05 ENCOUNTER — Other Ambulatory Visit: Payer: Self-pay

## 2023-10-05 ENCOUNTER — Ambulatory Visit: Admitting: Obstetrics and Gynecology

## 2023-10-05 VITALS — BP 115/84 | HR 83 | Wt 163.0 lb

## 2023-10-05 DIAGNOSIS — N83202 Unspecified ovarian cyst, left side: Secondary | ICD-10-CM

## 2023-10-05 DIAGNOSIS — R1013 Epigastric pain: Secondary | ICD-10-CM | POA: Diagnosis not present

## 2023-10-05 DIAGNOSIS — Z1331 Encounter for screening for depression: Secondary | ICD-10-CM | POA: Diagnosis not present

## 2023-10-05 NOTE — Progress Notes (Signed)
 GYNECOLOGY VISIT  Patient name: Vanessa Fischer MRN [id number]  Date of birth: December 10, 1965 Chief Complaint:   Gynecologic Exam  History:  Vanessa Fischer is a 58 y.o. G2P1011 being seen today for discussion of adnexal cyst.    Discussed the use of AI scribe software for clinical note transcription with the patient, who gave verbal consent to proceed.  History of Present Illness Vanessa Fischer is a 58 year old female who presents with an abdominal cyst.  In 2023, a CT scan for groin pain incidentally revealed a tubular structure in the abdomen. She has been monitoring this since then. After relocating from [place] , her OB GYN noted the absence of recent ultrasounds on her fibroids or cysts since the birth of her son given known history of fibroids. An ultrasound was performed, and last year, it showed the cyst had grown, leading to a recommendation for a surgical consult.  Two years ago, she experienced a perforated ulcer requiring emergency surgery due to impending sepsis. She has been following up with her doctor since then. An endoscopy six months ago revealed inflammation without infection. She perceives fluid retention in her abdomen, particularly near the ulcer site. Her history includes multiple abdominal surgeries, such as a myomectomy, C-section, and abdominoplasty, resulting in significant scar tissue and adhesions.  She experiences bloating and discomfort, with her weight increasing from 130 to 160 pounds, attributed to lifestyle changes and the cyst. She feels bloated and full after eating, which she associates with her abdominal issues. She uses magnesium  citrate to maintain regular bowel movements, as irregularity causes significant discomfort.  Her history of interstitial cystitis is managed with Myrbetriq. She is post-menopausal and intermittently experiences symptoms of interstitial cystitis. She uses estrogen pellets and occasionally experiences irritation, likely due to scar tissue. No  current sexual activity is reported.    Past Medical History:  Diagnosis Date   Asthma    Cystitis, interstitial    GERD (gastroesophageal reflux disease)    Intestinal ulcer    Irritable bowel syndrome    Thyroid  disease     Past Surgical History:  Procedure Laterality Date   AUGMENTATION MAMMAPLASTY Bilateral 2004   AUGMENTATION MAMMAPLASTY Bilateral 2010   AUGMENTATION MAMMAPLASTY Bilateral 2014   bone spurs     BREAST ENHANCEMENT SURGERY     BREAST SURGERY     BUNIONECTOMY     CESAREAN SECTION     COSMETIC SURGERY     LAPAROTOMY N/A 09/04/2021   Procedure: EXPLORATORY LAPAROTOMY with Olevia Bers;  Surgeon: Adalberto Acton, MD;  Location: [facility];  Service: General;  Laterality: N/A;   MYOMECTOMY     SCAR REVISION     TUBAL LIGATION      The following portions of the patient's history were reviewed and updated as appropriate: allergies, current medications, past family history, past medical history, past social history, past surgical history and problem list.   Health Maintenance:   Last pap 07/2040 Last mammogram: 2021 [facility]   Review of Systems:  Pertinent items are noted in HPI. Comprehensive review of systems was otherwise negative.   Objective:  Physical Exam BP 115/84   Pulse 83   Wt 163 lb (73.9 kg)   BMI 27.76 kg/m    Physical Exam Vitals and nursing note reviewed.  Constitutional:      Appearance: Normal appearance.  HENT:     Head: Normocephalic and atraumatic.  Pulmonary:     Effort: Pulmonary effort is normal.  Abdominal:  Comments: Upper midline vertical incision Mild tenderness of umbilicus with slight protrusion but no definitive hernia palpated   Skin:    General: Skin is warm and dry.  Neurological:     General: No focal deficit present.     Mental Status: She is alert.  Psychiatric:        Mood and Affect: Mood normal.        Behavior: Behavior normal.        Thought Content: Thought content normal.        Judgment:  Judgment normal.     Labs and Imaging US  PELVIC COMPLETE WITH TRANSVAGINAL Result Date: 09/29/2023 CLINICAL DATA:  Ovarian cyst left (previously 9cm) EXAM: TRANSABDOMINAL AND TRANSVAGINAL ULTRASOUND OF PELVIS TECHNIQUE: Both transabdominal and transvaginal ultrasound examinations of the pelvis were performed. Transabdominal technique was performed for global imaging of the pelvis including uterus, ovaries, adnexal regions, and pelvic cul-de-sac. It was necessary to proceed with endovaginal exam following the transabdominal exam to visualize the uterus and ovaries. COMPARISON:  Oct 28, 2022, September 04, 2021 FINDINGS: Uterus Measurements: 8.3 x 3.7 x 4.8 cm = volume: 76.7 mL. Intramural fibroid measuring 1.8 x 1.6 cm. Endometrium Thickness: 8 mm.  No focal abnormality visualized. Right ovary Measurements: 1.8 x 0.9 x 2.1 cm = volume: 1.8 mL. Normal appearance/no adnexal mass. Left ovary No discrete ovarian stroma visualized. The left ovary is filled with a single cyst, which measures 5.5 x 2.8 x 6.2 cm. Small hypoechoic nodule dependently along the cyst, measuring 7 mm. Other findings No abnormal free fluid. IMPRESSION: 1. Similar appearance of the left ovarian cyst, measuring 5.5 x 3.8 x 6.2 cm. A tiny region of nodularity noted within the cyst, measuring 7 mm may represent blood products or proteinaceous debris. Given the almost 2 years of stability, this is likely a benign cyst. 2. Intramural fibroid measuring 1.8 x 1.6 cm. Electronically Signed   By: Rance Burrows M.D.   On: 09/29/2023 10:40    ROMA negative 09/20/23    Assessment & Plan:   Assessment & Plan Cystic adnexal structure, likely dilated tube Cystic structure likely a benign dilated tube, size fluctuates between 6-9 mm, currently 6 mm. No surgical intervention due to benign nature and risk from adhesions. - Follow up with gastroenterologist for gastrointestinal symptoms and potential further imaging. - Consider CT or MRI if  gastrointestinal evaluation is unremarkable to assess cystic structure before surgical consideration. - Discussed that structure appears stable on repeat imaging without any concerning features and normal tumor markers, suspect benign process and not likely contributing to GI symptoms - Given likely benign collection, suspect hydrosalpinx vs peritoneal inclusion cyst, discuss may be greater risk than benefit in surgical removal given extensive abdominal history and high likelihood of complex adhesive disease; if surgical rout pursued, would be concomitant with general surgery  Adhesions from prior surgeries Suspect extensive adhesions from multiple abdominal surgeries, particularly in the setting of perforated ulcer, may contribute to gastrointestinal symptoms. Surgical intervention risks further complications. - Follow up with gastroenterologist to evaluate gastrointestinal symptoms    Routine preventative health maintenance measures emphasized.  Kiki Pelton, MD Minimally Invasive Gynecologic Surgery [facility], [facility]

## 2023-10-09 DIAGNOSIS — F988 Other specified behavioral and emotional disorders with onset usually occurring in childhood and adolescence: Secondary | ICD-10-CM | POA: Diagnosis not present

## 2023-10-09 DIAGNOSIS — J309 Allergic rhinitis, unspecified: Secondary | ICD-10-CM | POA: Diagnosis not present

## 2023-10-09 DIAGNOSIS — Z0000 Encounter for general adult medical examination without abnormal findings: Secondary | ICD-10-CM | POA: Diagnosis not present

## 2023-10-09 DIAGNOSIS — F411 Generalized anxiety disorder: Secondary | ICD-10-CM | POA: Diagnosis not present

## 2023-10-09 DIAGNOSIS — M19042 Primary osteoarthritis, left hand: Secondary | ICD-10-CM | POA: Diagnosis not present

## 2023-10-09 DIAGNOSIS — G43009 Migraine without aura, not intractable, without status migrainosus: Secondary | ICD-10-CM | POA: Diagnosis not present

## 2023-10-09 DIAGNOSIS — Z13220 Encounter for screening for lipoid disorders: Secondary | ICD-10-CM | POA: Diagnosis not present

## 2023-10-09 DIAGNOSIS — J4520 Mild intermittent asthma, uncomplicated: Secondary | ICD-10-CM | POA: Diagnosis not present

## 2023-10-09 DIAGNOSIS — F5101 Primary insomnia: Secondary | ICD-10-CM | POA: Diagnosis not present

## 2023-10-09 DIAGNOSIS — E039 Hypothyroidism, unspecified: Secondary | ICD-10-CM | POA: Diagnosis not present

## 2023-10-09 DIAGNOSIS — N3010 Interstitial cystitis (chronic) without hematuria: Secondary | ICD-10-CM | POA: Diagnosis not present

## 2023-10-09 DIAGNOSIS — F332 Major depressive disorder, recurrent severe without psychotic features: Secondary | ICD-10-CM | POA: Diagnosis not present

## 2023-10-09 DIAGNOSIS — K219 Gastro-esophageal reflux disease without esophagitis: Secondary | ICD-10-CM | POA: Diagnosis not present

## 2024-01-03 DIAGNOSIS — F4323 Adjustment disorder with mixed anxiety and depressed mood: Secondary | ICD-10-CM | POA: Diagnosis not present

## 2024-01-03 DIAGNOSIS — F902 Attention-deficit hyperactivity disorder, combined type: Secondary | ICD-10-CM | POA: Diagnosis not present

## 2024-03-09 IMAGING — RF DG UGI W SINGLE CM
14 of 23 series · 14 of 24 positions shown · non-contrast
Comparison: NONE.

CLINICAL DATA: Patient status post exploratory laparotomy, omental
pedicle flap/Kievagh Kinch closure perforated pre-pyloric ulcer.

EXAM:
DG UGI W SINGLE CM
TECHNIQUE: Scout radiograph was obtained. Fluoroscopic examination was
performed using water-soluble contrast. This exam was performed by
Klever Jumper, NP, and was supervised and interpreted by CT scan
09/04/2021.
FLUOROSCOPY:
Radiation Exposure Index (as provided by the fluoroscopic device):
1.8 mGy Kerma

[Series 1: t abdomen · 1 of 1 slices shown]
[im 1/1]
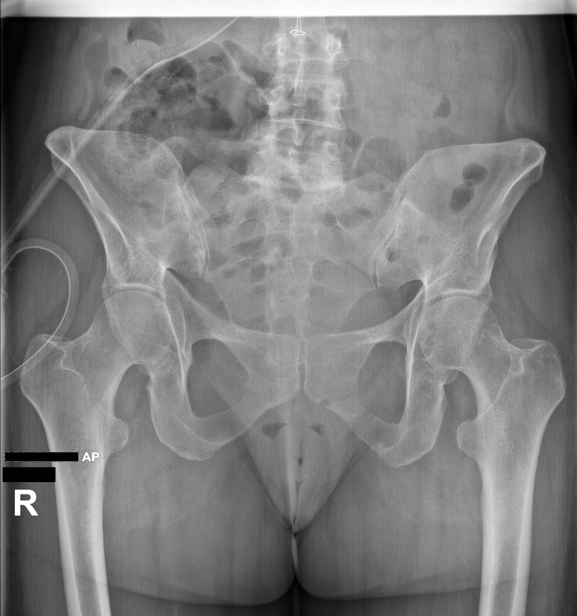

[Series 4: cp_standard · 1 of 10 frames shown (1 of 13)]
[frame 9/10]
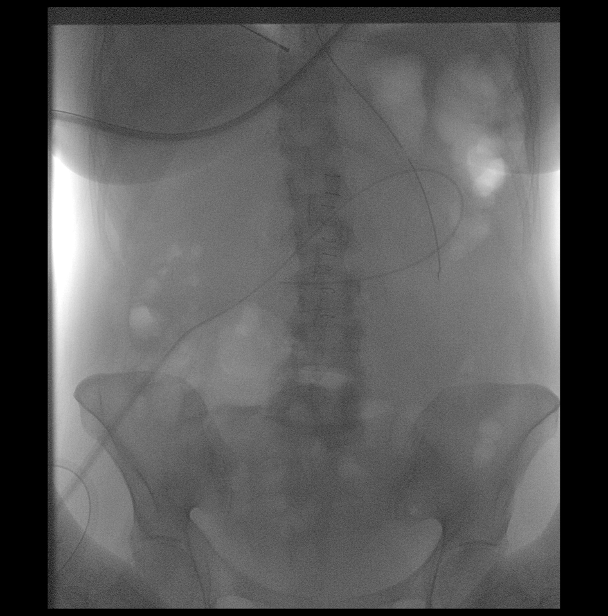

[Series 6: cp_standard · 1 of 10 frames shown (2 of 13)]
[frame 4/10]
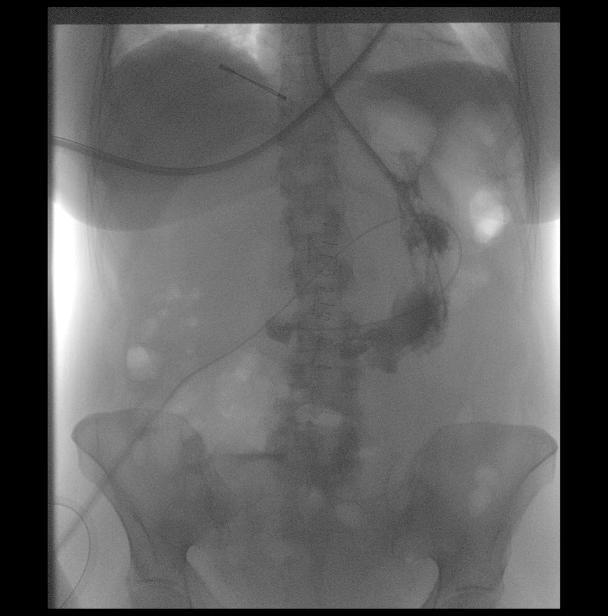

[Series 8: cp_standard · 1 of 4 frames shown (3 of 13)]
[frame 3/4]
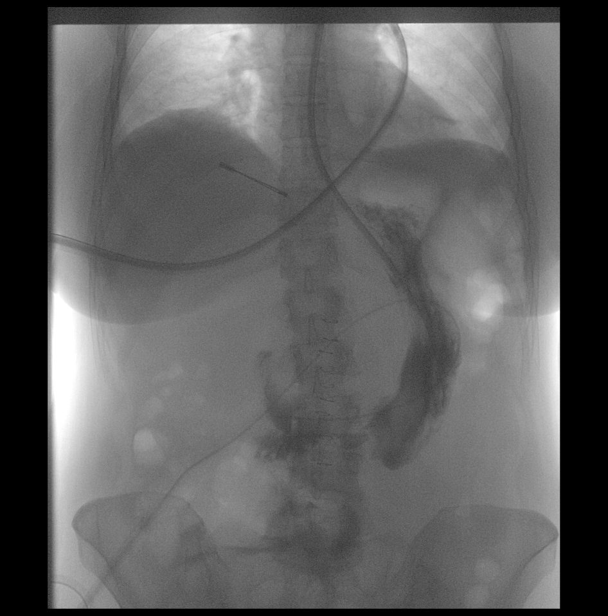

[Series 9: cp_standard · 1 of 6 frames shown (4 of 13)]
[frame 4/6]
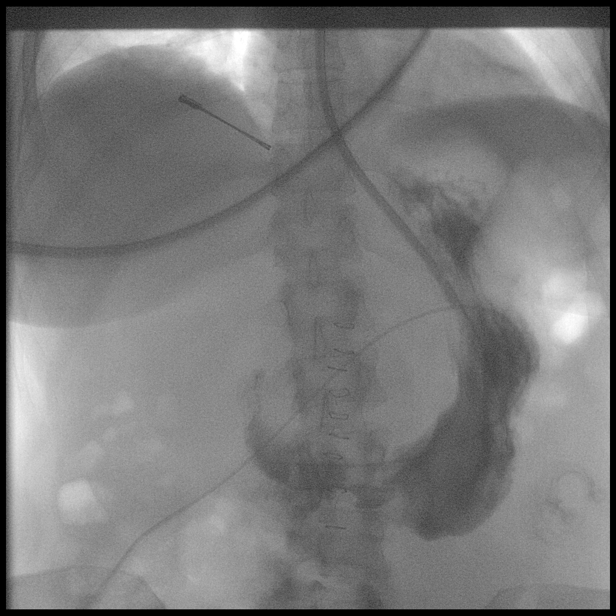

[Series 11: cp_standard · 1 of 49 frames shown (5 of 13)]
[frame 25/49]
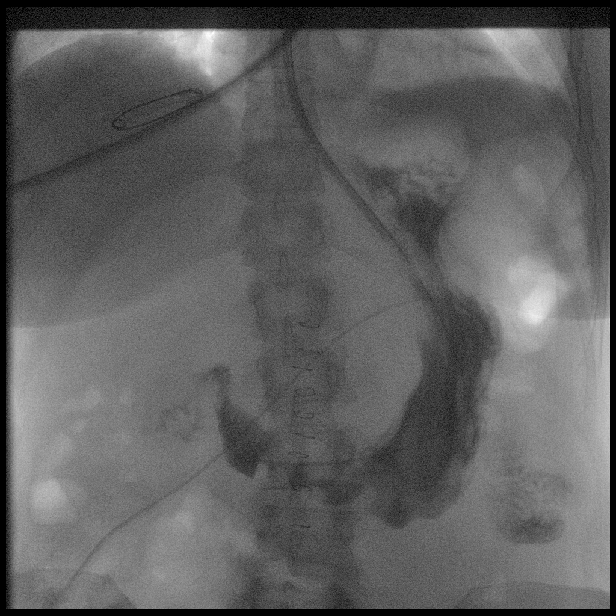

[Series 13: cp_standard · 1 of 44 frames shown (6 of 13)]
[frame 38/44]
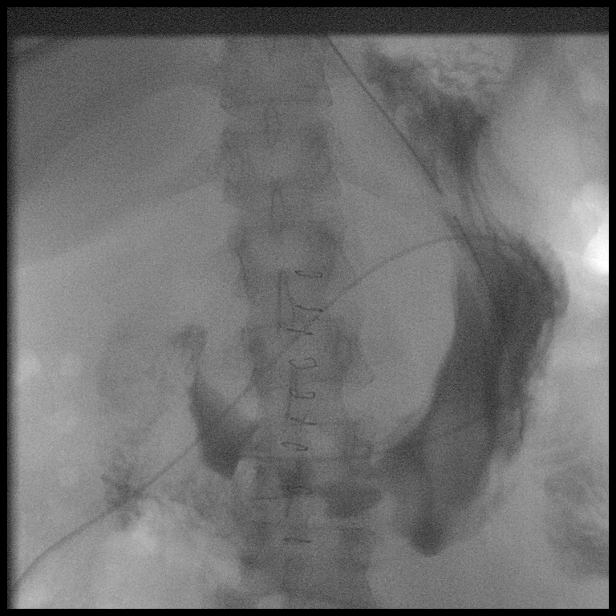

[Series 14: cp_standard · 1 of 47 frames shown (7 of 13)]
[frame 11/47]
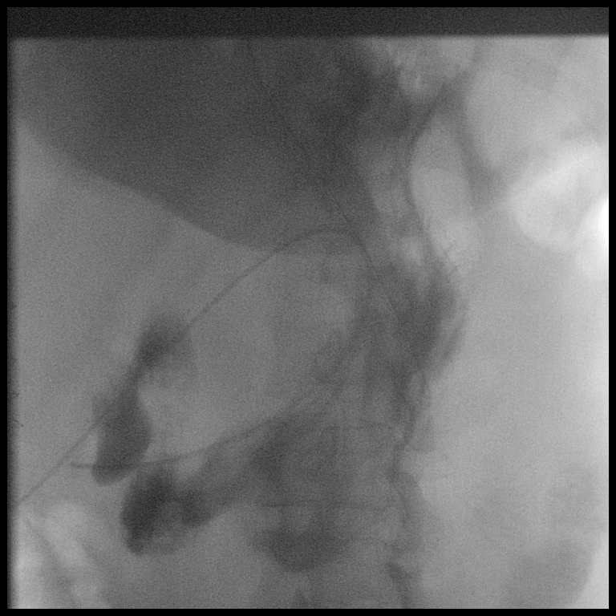

[Series 16: cp_standard · 1 of 91 frames shown (8 of 13)]
[frame 14/91]
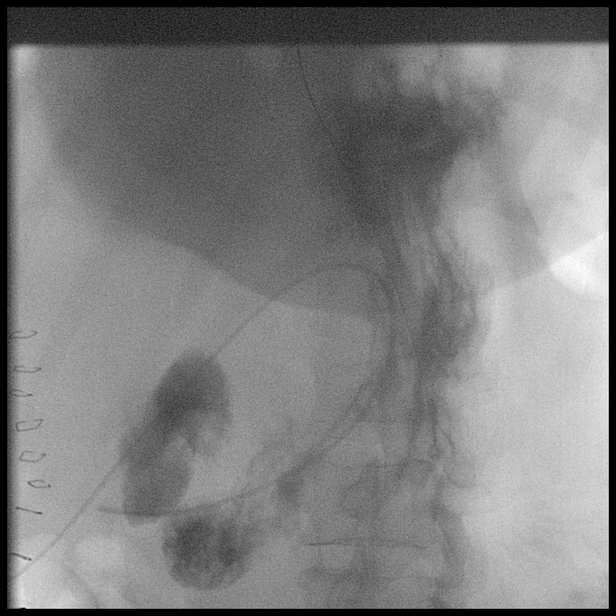

[Series 18: cp_standard · 1 of 77 frames shown (9 of 13)]
[frame 32/77]
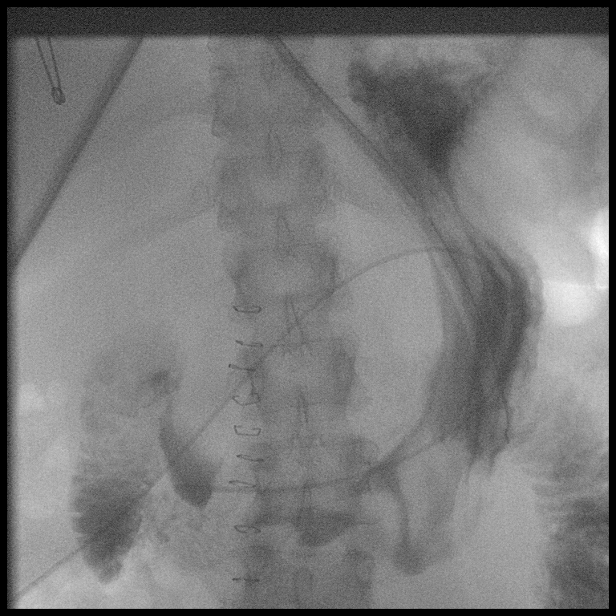

[Series 20: cp_standard · 1 of 85 frames shown (10 of 13)]
[frame 11/85]
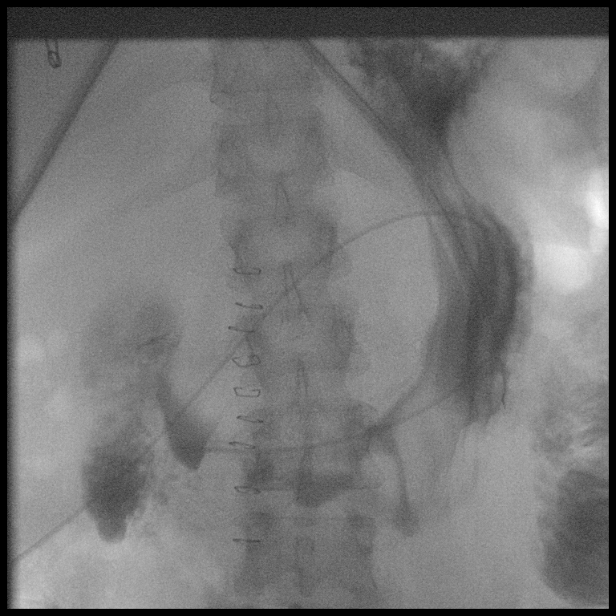

[Series 21: cp_standard · 1 of 44 frames shown (11 of 13)]
[frame 7/44]
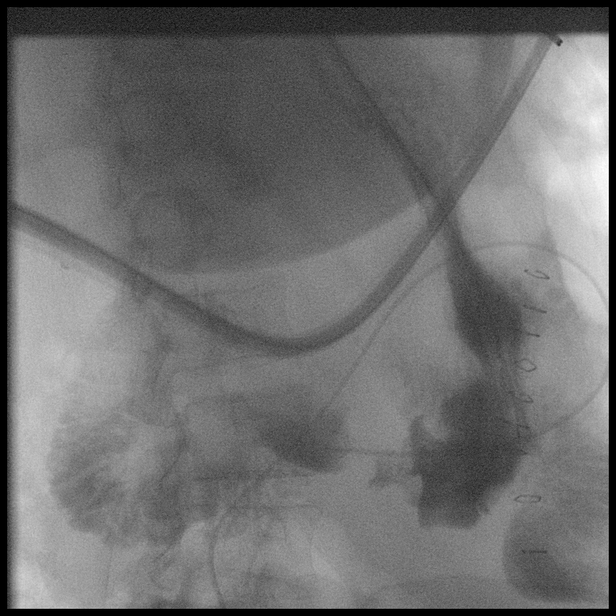

[Series 22: cp_standard · 1 of 61 frames shown (12 of 13)]
[frame 52/61]
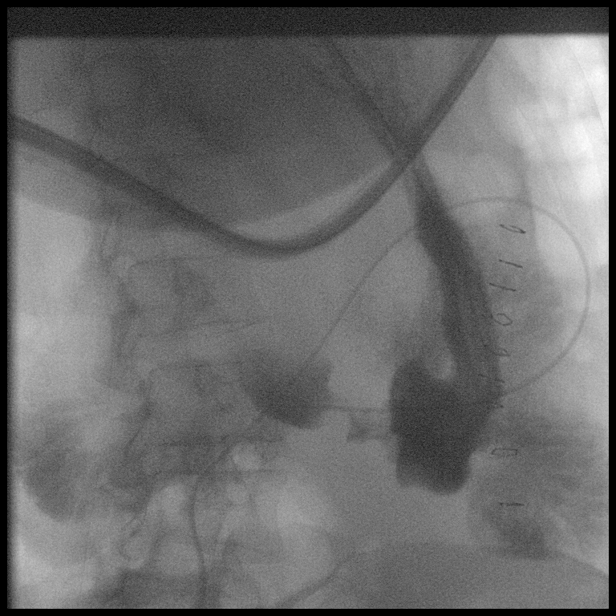

[Series 24: cp_standard · 1 of 106 frames shown (13 of 13)]
[frame 91/106]
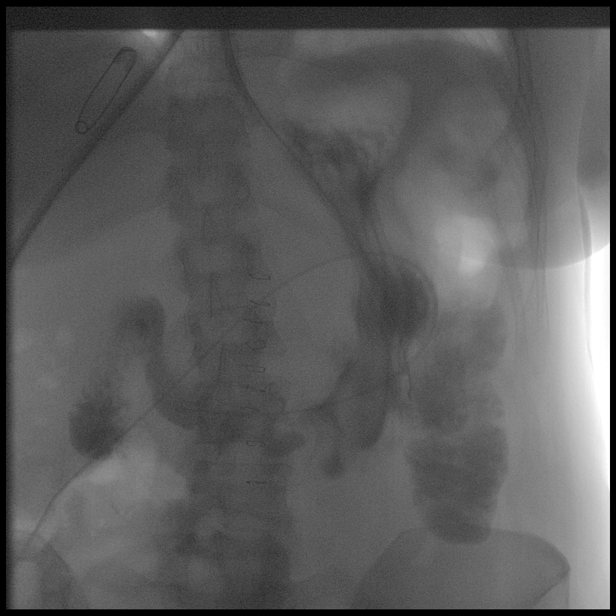

[14 of 24 positions shown; findings below may reference images not displayed]

FINDINGS: Scout Radiograph:  Normal.

Esophagus: Deferred

Esophageal motility: Deferred

Gastroesophageal reflux: Deferred

Stomach: Normal appearance. No hiatal hernia.

Gastric emptying: Normal transit of contrast.

Duodenum: Normal appearance. No contrast extravasation/leak is
identified.

Other:  None.
IMPRESSION: Normal examination of the stomach and duodenum. No contrast
extravasation/leak is identified.

## 2024-04-03 DIAGNOSIS — F4323 Adjustment disorder with mixed anxiety and depressed mood: Secondary | ICD-10-CM | POA: Diagnosis not present

## 2024-04-03 DIAGNOSIS — F902 Attention-deficit hyperactivity disorder, combined type: Secondary | ICD-10-CM | POA: Diagnosis not present

## 2024-04-11 DIAGNOSIS — E785 Hyperlipidemia, unspecified: Secondary | ICD-10-CM | POA: Diagnosis not present

## 2024-04-11 DIAGNOSIS — G43009 Migraine without aura, not intractable, without status migrainosus: Secondary | ICD-10-CM | POA: Diagnosis not present

## 2024-05-17 IMAGING — DX DG ABDOMEN 1V
1 series · 1 of 1 positions shown · non-contrast
Comparison: September 07, 2021

CLINICAL DATA: Left-sided abdomen pain for 2 days. Evaluate for
stool burden.

EXAM:
ABDOMEN - 1 VIEW

[abdomen kub]
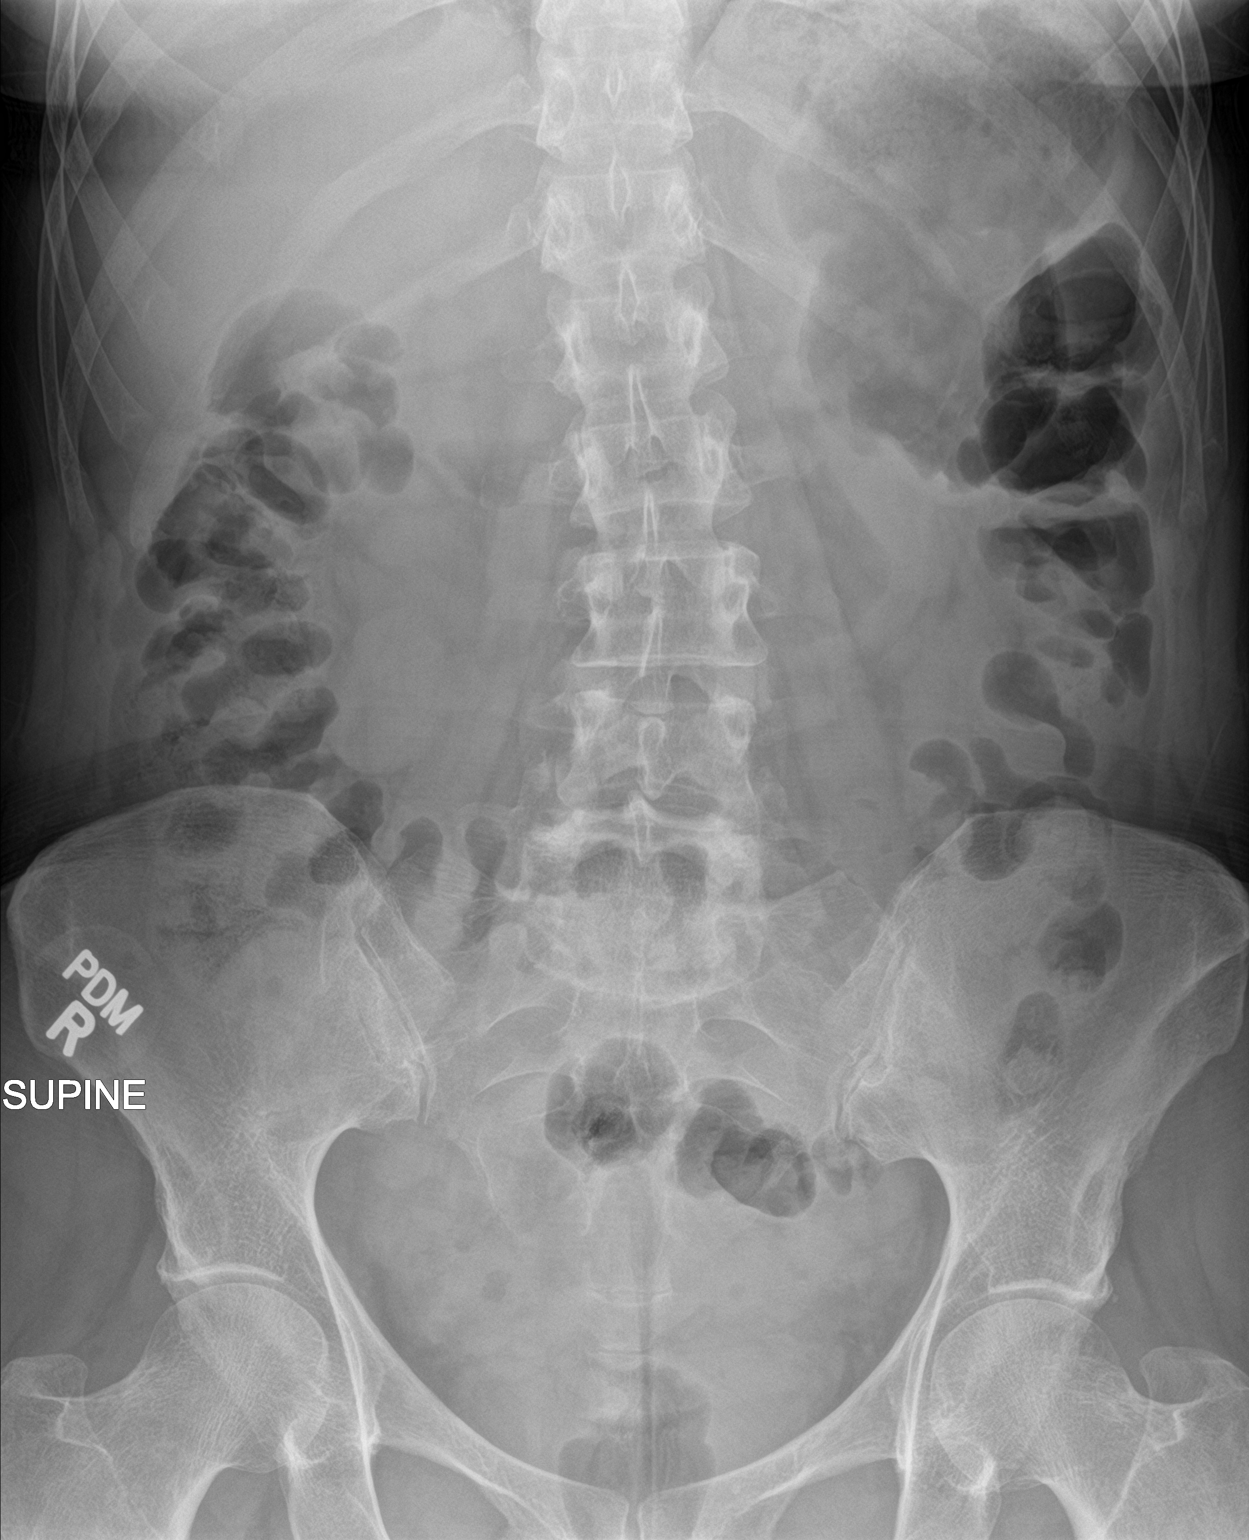

[1 of 1 positions shown; findings below may reference images not displayed]

FINDINGS: The bowel gas pattern is normal. No radio-opaque calculi or other
significant radiographic abnormality are seen. Scoliosis of spine
noted. Mild bowel content is identified in the colon.
IMPRESSION: No bowel obstruction. Mild bowel content is identified in the colon.

## 2024-05-17 IMAGING — DX DG HAND COMPLETE 3+V*L*
3 series · 3 of 3 positions shown · non-contrast
Comparison: Left hand radiographs 02/14/2018

CLINICAL DATA: Mid posterior left hand pain status post
hospitalization 2 months ago when IV infiltrated in this region.

EXAM:
LEFT HAND - COMPLETE 3+ VIEW

[view not recorded (1 of 3)]
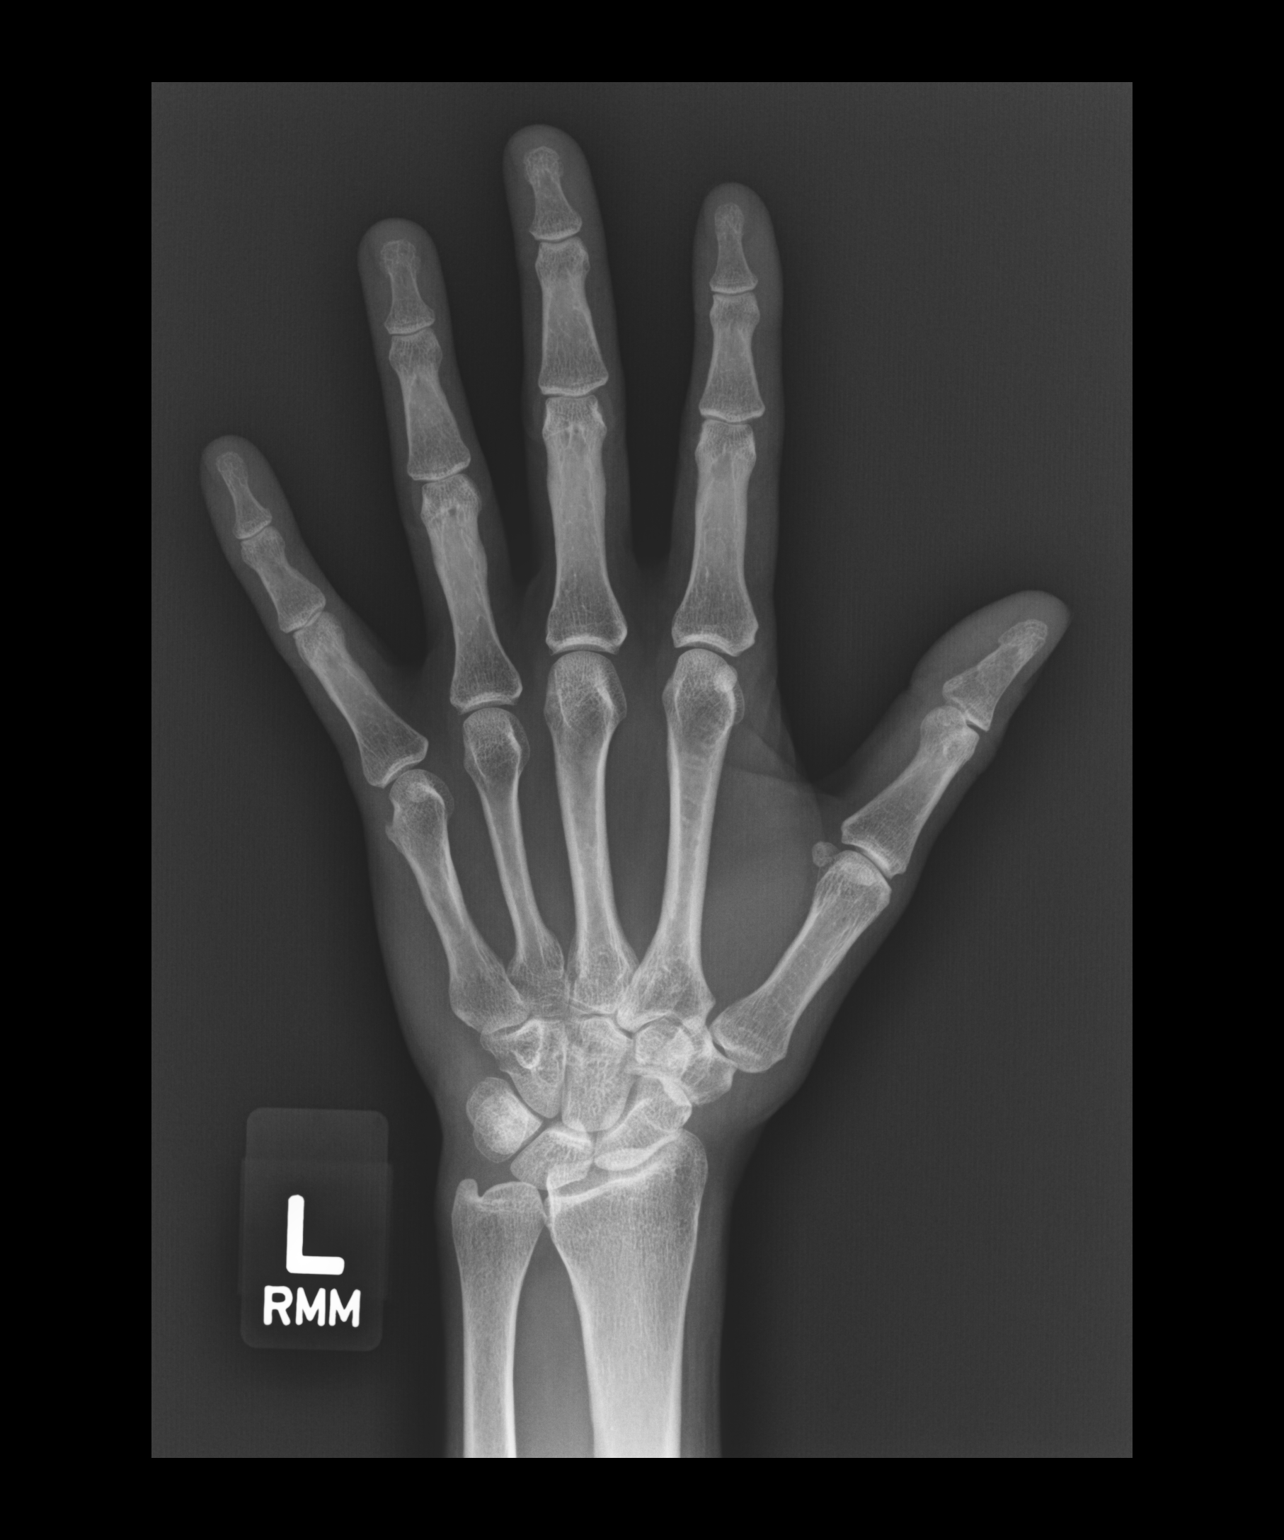

[view not recorded (2 of 3)]
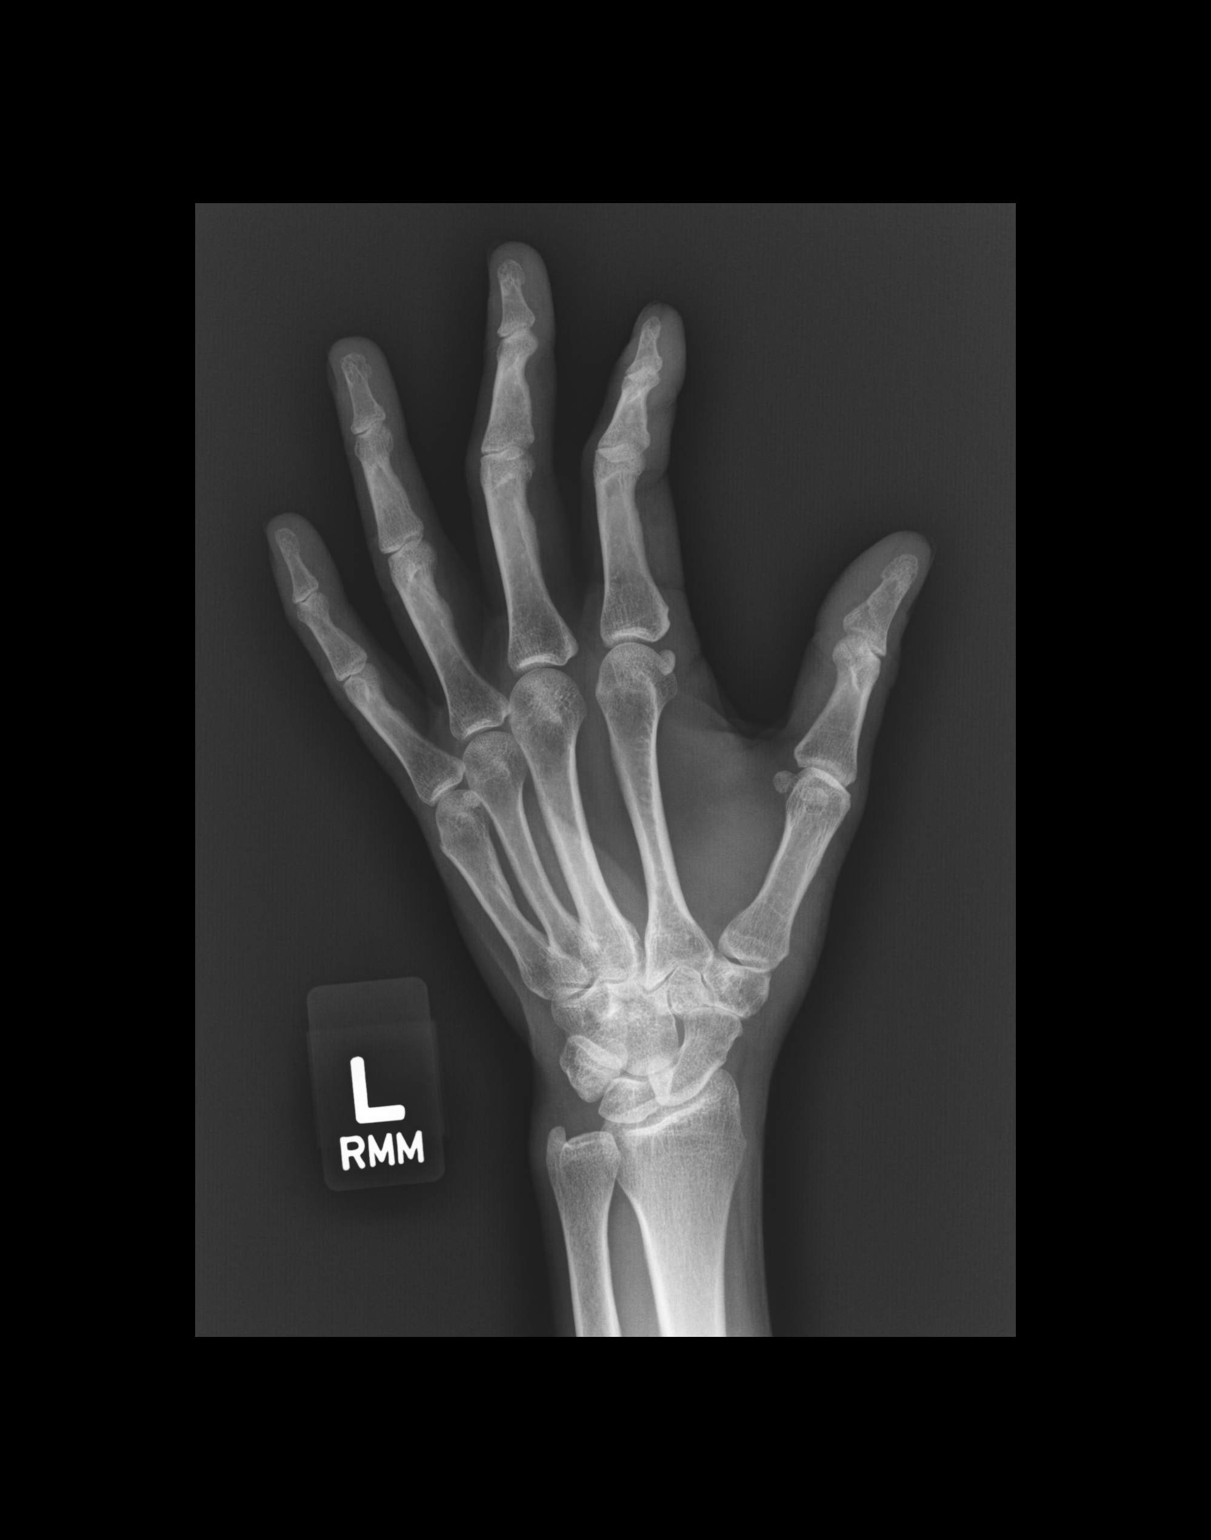

[view not recorded (3 of 3)]
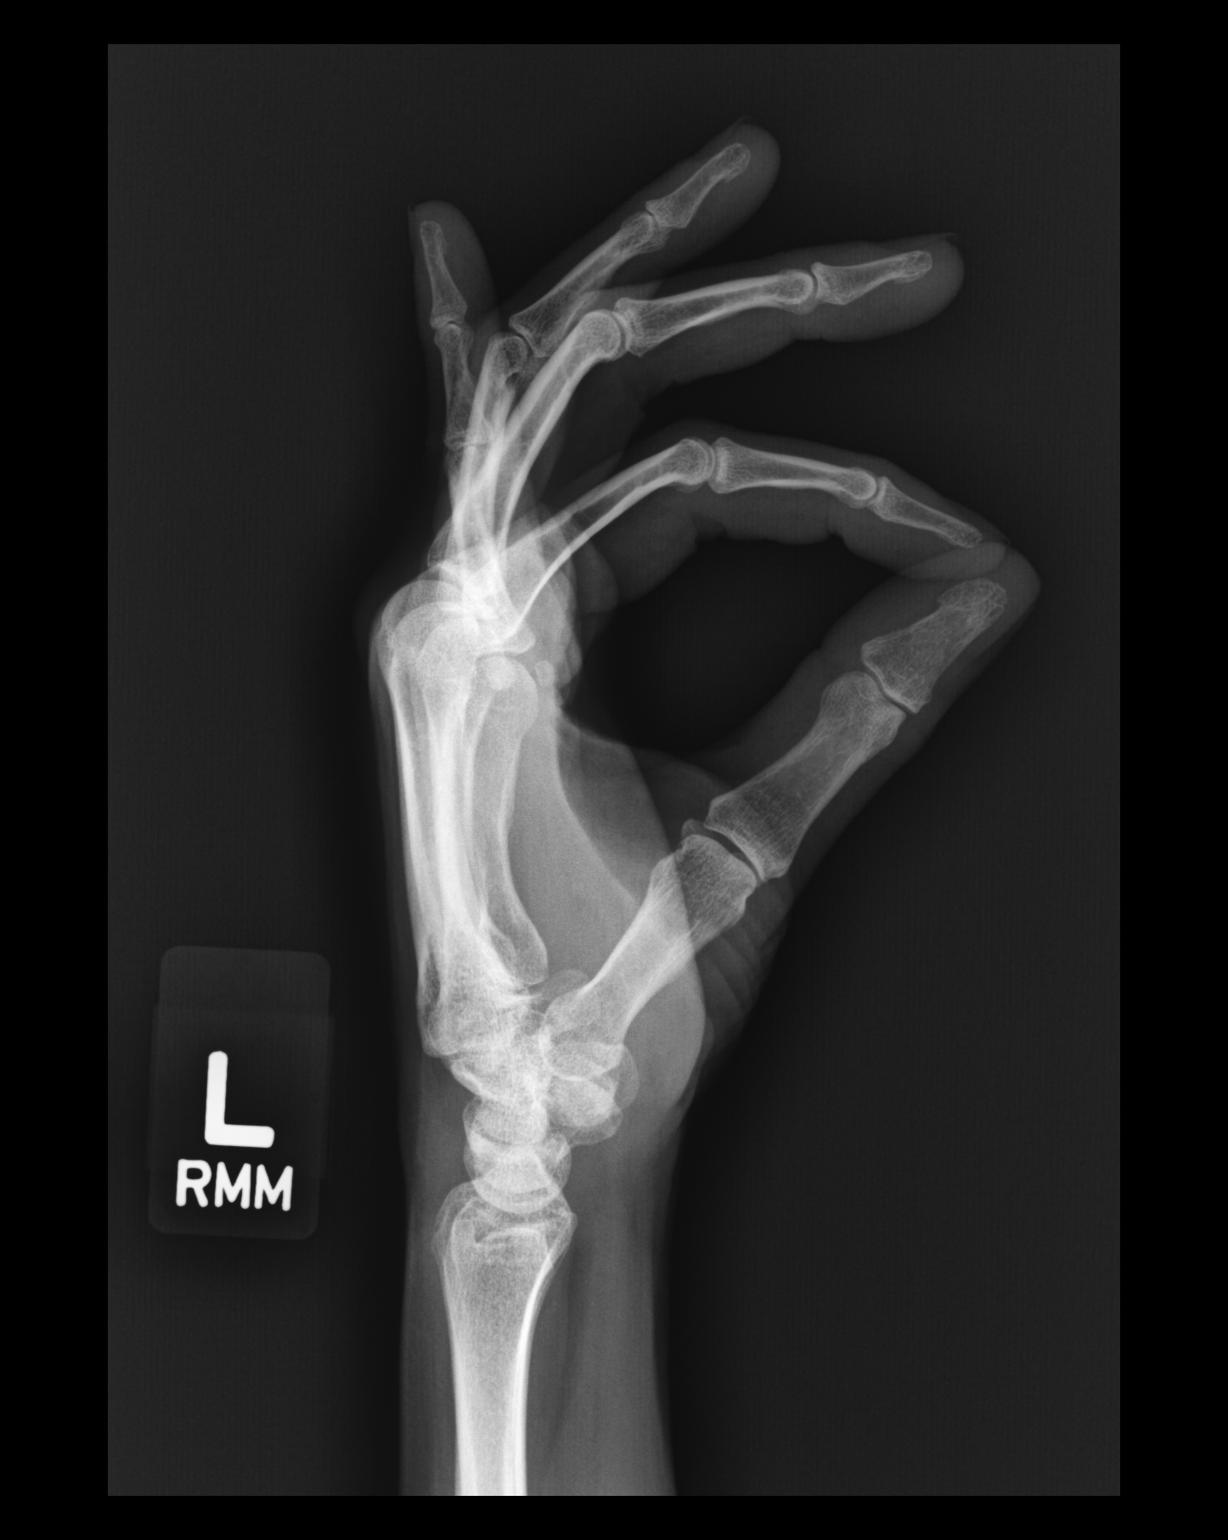

[3 of 3 positions shown; findings below may reference images not displayed]

FINDINGS: Interval complete healing of the previously seen fracture of the mid
to distal shaft of the fifth metacarpal. Severe triscaphe and mild
to moderate first and second carpometacarpal joint space narrowing.
No acute fracture is seen. No dislocation.
IMPRESSION: 1. Severe triscaphe osteoarthritis, worsened from prior.
2. Interval healing of fifth metacarpal shaft fracture.

## 2024-06-03 ENCOUNTER — Encounter
